# Patient Record
Sex: Male | Born: 1935 | Race: Asian | Hispanic: No | Marital: Married | Smoking: Former smoker
Health system: Southern US, Community
[De-identification: ages and names within clinical notes are randomized; demographics above are authoritative.]

## PROBLEM LIST (undated history)

## (undated) DIAGNOSIS — E119 Type 2 diabetes mellitus without complications: Secondary | ICD-10-CM

## (undated) DIAGNOSIS — I714 Abdominal aortic aneurysm, without rupture, unspecified: Secondary | ICD-10-CM

## (undated) DIAGNOSIS — M1990 Unspecified osteoarthritis, unspecified site: Secondary | ICD-10-CM

## (undated) DIAGNOSIS — K5900 Constipation, unspecified: Secondary | ICD-10-CM

## (undated) DIAGNOSIS — E785 Hyperlipidemia, unspecified: Secondary | ICD-10-CM

## (undated) DIAGNOSIS — I10 Essential (primary) hypertension: Secondary | ICD-10-CM

## (undated) HISTORY — PX: EYE SURGERY: SHX253

## (undated) HISTORY — DX: Essential (primary) hypertension: I10

## (undated) HISTORY — PX: ABDOMINAL ADHESION SURGERY: SHX90

## (undated) HISTORY — DX: Hyperlipidemia, unspecified: E78.5

## (undated) HISTORY — DX: Type 2 diabetes mellitus without complications: E11.9

## (undated) HISTORY — DX: Abdominal aortic aneurysm, without rupture: I71.4

## (undated) HISTORY — PX: COLONOSCOPY: SHX174

## (undated) HISTORY — DX: Abdominal aortic aneurysm, without rupture, unspecified: I71.40

---

## 2009-06-29 ENCOUNTER — Ambulatory Visit: Payer: Self-pay | Admitting: Vascular Surgery

## 2010-07-26 ENCOUNTER — Ambulatory Visit
Admission: RE | Admit: 2010-07-26 | Discharge: 2010-07-26 | Payer: Self-pay | Source: Home / Self Care | Attending: Vascular Surgery | Admitting: Vascular Surgery

## 2010-07-26 ENCOUNTER — Ambulatory Visit: Admit: 2010-07-26 | Payer: Self-pay | Admitting: Vascular Surgery

## 2010-12-06 NOTE — Assessment & Plan Note (Signed)
OFFICE VISIT   Frederick [name], CONGROVE MUK  DOB:  [birth year]                                       07/26/2010  CHART#:[id number]   Patient returns today for annual follow-up regarding his infrarenal  abdominal aortic aneurysm that I saw him for last year.  At that time it  measured 4.1 cm in maximum diameter.  He has had no abdominal or back  symptoms in the last year and returns for routine follow-up.   CHRONIC MEDICAL PROBLEMS:  1. Hypertension.  2. History of small bowel obstruction and lysis of adhesions.  3. Left renal cyst.  4. Negative for coronary artery disease, diabetes, COPD, or stroke.   FAMILY HISTORY:  Negative for coronary artery disease, diabetes, and  stroke.   REVIEW OF SYSTEMS:  He denies any chest pain, dyspnea on exertion, PND,  orthopnea.  No chronic bronchitis.  Denies claudication symptoms.  All  other systems reviewed in a complete review of systems were negative.   PHYSICAL EXAMINATION:  Blood pressure 122/80, heart rate 76,  respirations 16.  Generally, he is a well-developed and well-nourished,  thin male in no apparent distress, alert and oriented x3.  General:  He  is a well-developed and well-nourished male in no apparent distress,  alert and oriented x3.  HEENT:  Normal for age.  EOMs intact.  Lungs:  Clear to auscultation.  No rhonchi or wheezing.  Cardiovascular:  Regular rhythm.  No murmurs.  Carotid pulses are 3+.  No audible bruits.  Abdomen:  Soft, nontender with a 4-5 cm pulsatile mass.  Musculoskeletal:  Free of major deformities.  Neurologic:  Normal.  Lower extremity exam reveals 3+ femoral and popliteal pulse on the right  bilaterally.   Today I ordered a duplex scan of his abdominal aorta which reveals the  aneurysm has enlarged slightly to 4.4 cm in maximum diameter.  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   I have reassured him regarding these findings, but we will need to  follow him on an annual  basis.  We will return in 1 year with a duplex  scan unless he develops any symptoms in the interim.     Quita Skye Hart [place], M.D.  Electronically Signed   JDL/MEDQ  D:  07/26/2010  T:  07/26/2010  Job:  [id number]

## 2010-12-06 NOTE — Procedures (Signed)
DUPLEX ULTRASOUND OF ABDOMINAL AORTA   INDICATION:  Abdominal aortic aneurysm.   HISTORY:  Diabetes:  No.  Cardiac:  No.  Hypertension:  Yes.  Smoking:  Previous.  Connective Tissue Disorder:  Family History:  Previous Surgery:   DUPLEX EXAM:         AP (cm)                   TRANSVERSE (cm)  Proximal             2.4 cm                    2.4 cm  Mid                  2.3 cm                    2.3 cm  Distal               4.4 cm                    4.4 cm  Right Iliac          2.8 cm                    2.8 cm  Left Iliac           2.1 cm                    2.2 cm   PREVIOUS:  Date:  AP:  TRANSVERSE:   IMPRESSION:  Aneurysmal dilatation of the distal abdominal aorta and  bilateral proximal common iliac arteries noted with maximum diameter  measurements as described above.   ___________________________________________  Larina Earthly, M.D.   CH/MEDQ  D:  07/27/2010  T:  07/27/2010  Job:  [id number]

## 2010-12-06 NOTE — Consult Note (Signed)
NEW PATIENT CONSULTATION   Frederick [name], Frederick [name]  DOB:  [birth year]                                       06/29/2009  CHART#:[id number]   The patient is a 75 year old male patient, referred by Dr. Dimas Chyle, for  abdominal aortic aneurysm.  Much of the history was obtained from  conversation with his son, who accompanied him, and was the translator.  The patient has a history of lysis of adhesions for small bowel  obstruction, fixed laparoscopically 1 year ago.  Recently, he had some  abdominal pain, was seen by Dr. Dimas Chyle in [place].  An ultrasound was  obtained which revealed an aortic aneurysm measuring 4.1 cm in maximum  diameter, the left iliac artery having a 2.3 cm diameter.  He also had a  left renal cyst.  He is not currently having abdominal discomfort.  I  have reviewed the ultrasound report and have also reviewed his  laboratory data which was obtained at the time of his referral from Dr.  Dimas Chyle.   Current chronic medical problems appear stable:  1. Hypertension.  2. History of the small bowel obstruction with lysis of adhesions.  3. Left renal cyst.  4. Negative for coronary artery disease, diabetes, COPD or stroke.   FAMILY HISTORY:  Negative for coronary artery disease, diabetes and  stroke.   SOCIAL HISTORY:  He is married; denies any alcohol, tobacco or drug use.   REVIEW OF SYSTEMS:  Denies any chest pain, dyspnea on exertion, PND,  orthopnea, claudication, deep venous thrombosis.  All systems were  reviewed and all were negative.   PHYSICAL EXAMINATION:  Blood pressure 136/81, heart rate 88,  respirations 14 and temperature 98.  General:  He is alert and oriented  x3.  He is well-developed, well-nourished male who is in no apparent  distress.  HEENT:  Exam unremarkable.  Neck:  Supple, 3+ carotid pulses  palpable.  No bruits are audible.  Chest:  Clear to auscultation.  Cardiovascular:  Regular rhythm.  No murmurs.  Neurologic:  Normal.  Abdomen:  Soft, nontender with 4 cm pulsatile mass in the mid  epigastrium.  He has no abdominal wounds other than a periumbilical  wound.  He has 3+ femoral and posterior tibial pulses bilaterally.  There are no skin rashes noted.  Musculoskeletal:  Exam reveals no major  deformities.   I had a long discussion with him and his son regarding the aortic  aneurysm and the fact that it is not large enough at this point to  require any treatment.  I ordered a repeat duplex scan to be performed  in 1 year and he will return office with the [facility] for further  follow-up of this.  If he has any acute abdominal symptoms in the  interim, he will report to the emergency department for CT scan to be  certain that the aneurysm is not leaking.   Quita Skye Hart [place], M.D.  Electronically Signed   JDL/MEDQ  D:  06/29/2009  T:  06/30/2009  Job:  [id number]   cc:   Bettey Mare

## 2011-11-03 ENCOUNTER — Other Ambulatory Visit: Payer: Self-pay | Admitting: Vascular Surgery

## 2012-02-28 ENCOUNTER — Encounter: Payer: Self-pay | Admitting: Vascular Surgery

## 2012-09-13 ENCOUNTER — Telehealth: Payer: Self-pay | Admitting: Vascular Surgery

## 2012-09-13 NOTE — Telephone Encounter (Signed)
Received referral from [facility] [phone] for vv's right leg.   Since the patient doesn't speak [place], I spoke with his son which said Frederick [name] is not wanting to schedule an appointment now.   I also mentioned that he is past due for his AAA duplex (we sent a letter in August 2013 (chart audit) with no response).   He hasn't had an ultrasound since 2012 but at this time he doesn't wish to schedule this either.   I notified the referring office by leaving a voice message with [facility].  Juliette Alcide

## 2014-05-11 ENCOUNTER — Telehealth: Payer: Self-pay

## 2014-05-11 DIAGNOSIS — I714 Abdominal aortic aneurysm, without rupture, unspecified: Secondary | ICD-10-CM

## 2014-05-11 NOTE — Telephone Encounter (Signed)
Phone call from pt's son.  Reported pt. C/o "mild abdominal discomfort over the weekend."  Reported the pt. Stated the discomfort is better today.  Also, stated the pt. denies any back pain.  Denies nausea/vomiting.  Hasn't been seen in our office since 2010.  Advised will schedule pt. for abdominal U/S, and office visit.  Encouraged son to report worsening signs of abdominal pain, or back pain, if occurs prior to office appt.  Verb. Understanding.

## 2014-05-18 ENCOUNTER — Ambulatory Visit (HOSPITAL_COMMUNITY)
Admission: RE | Admit: 2014-05-18 | Discharge: 2014-05-18 | Disposition: A | Discharge status: Home / Self Care | Payer: Medicare HMO | Source: Ambulatory Visit | Attending: Vascular Surgery | Admitting: Vascular Surgery

## 2014-05-18 DIAGNOSIS — I714 Abdominal aortic aneurysm, without rupture, unspecified: Secondary | ICD-10-CM

## 2014-05-25 ENCOUNTER — Encounter: Payer: Self-pay | Admitting: Vascular Surgery

## 2014-05-26 ENCOUNTER — Encounter: Payer: Self-pay | Admitting: Vascular Surgery

## 2014-05-26 ENCOUNTER — Ambulatory Visit (INDEPENDENT_AMBULATORY_CARE_PROVIDER_SITE_OTHER): Payer: Medicare HMO | Admitting: Vascular Surgery

## 2014-05-26 VITALS — BP 143/84 | HR 62 | Temp 97.9°F | Resp 16 | Ht 70.0 in | Wt 155.4 lb

## 2014-05-26 DIAGNOSIS — I714 Abdominal aortic aneurysm, without rupture, unspecified: Secondary | ICD-10-CM

## 2014-05-26 DIAGNOSIS — Z01810 Encounter for preprocedural cardiovascular examination: Secondary | ICD-10-CM

## 2014-05-26 NOTE — Patient Instructions (Signed)
Abdominal Aortic Aneurysm An aneurysm is a weakened or damaged part of an artery wall that bulges from the normal force of blood pumping through the body. An abdominal aortic aneurysm is an aneurysm that occurs in the lower part of the aorta, the main artery of the body.  The major concern with an abdominal aortic aneurysm is that it can enlarge and burst (rupture) or blood can flow between the layers of the wall of the aorta through a tear (aorticdissection). Both of these conditions can cause bleeding inside the body and can be life threatening unless diagnosed and treated promptly. CAUSES  The exact cause of an abdominal aortic aneurysm is unknown. Some contributing factors are:   A hardening of the arteries caused by the buildup of fat and other substances in the lining of a blood vessel (arteriosclerosis).  Inflammation of the walls of an artery (arteritis).   Connective tissue diseases, such as Marfan syndrome.   Abdominal trauma.   An infection, such as syphilis or staphylococcus, in the wall of the aorta (infectious aortitis) caused by bacteria. RISK FACTORS  Risk factors that contribute to an abdominal aortic aneurysm may include:  Age older than 60 years.   High blood pressure (hypertension).  Male gender.  Ethnicity (white race).  Obesity.  Family history of aneurysm (first degree relatives only).  Tobacco use. PREVENTION  The following healthy lifestyle habits may help decrease your risk of abdominal aortic aneurysm:  Quitting smoking. Smoking can raise your blood pressure and cause arteriosclerosis.  Limiting or avoiding alcohol.  Keeping your blood pressure, blood sugar level, and cholesterol levels within normal limits.  Decreasing your salt intake. In somepeople, too much salt can raise blood pressure and increase your risk of abdominal aortic aneurysm.  Eating a diet low in saturated fats and cholesterol.  Increasing your fiber intake by including  whole grains, vegetables, and fruits in your diet. Eating these foods may help lower blood pressure.  Maintaining a healthy weight.  Staying physically active and exercising regularly. SYMPTOMS  The symptoms of abdominal aortic aneurysm may vary depending on the size and rate of growth of the aneurysm.Most grow slowly and do not have any symptoms. When symptoms do occur, they may include:  Pain (abdomen, side, lower back, or groin). The pain may vary in intensity. A sudden onset of severe pain may indicate that the aneurysm has ruptured.  Feeling full after eating only small amounts of food.  Nausea or vomiting or both.  Feeling a pulsating lump in the abdomen.  Feeling faint or passing out. DIAGNOSIS  Since most unruptured abdominal aortic aneurysms have no symptoms, they are often discovered during diagnostic exams for other conditions. An aneurysm may be found during the following procedures:  Ultrasonography (A one-time screening for abdominal aortic aneurysm by ultrasonography is also recommended for all men aged 65-75 years who have ever smoked).  X-ray exams.  A computed tomography (CT).  Magnetic resonance imaging (MRI).  Angiography or arteriography. TREATMENT  Treatment of an abdominal aortic aneurysm depends on the size of your aneurysm, your age, and risk factors for rupture. Medication to control blood pressure and pain may be used to manage aneurysms smaller than 6 cm. Regular monitoring for enlargement may be recommended by your caregiver if:  The aneurysm is 3-4 cm in size (an annual ultrasonography may be recommended).  The aneurysm is 4-4.5 cm in size (an ultrasonography every 6 months may be recommended).  The aneurysm is larger than 4.5 cm in   size (your caregiver may ask that you be examined by a vascular surgeon). If your aneurysm is larger than 6 cm, surgical repair may be recommended. There are two main methods for repair of an aneurysm:   Endovascular  repair (a minimally invasive surgery). This is done most often.  Open repair. This method is used if an endovascular repair is not possible. Document Released: 04/19/2005 Document Revised: 11/04/2012 Document Reviewed: 08/09/2012 ExitCare Patient Information 2015 [employer]. This information is not intended to replace advice given to you by your health care provider. Make sure you discuss any questions you have with your health care provider.  

## 2014-05-26 NOTE — Progress Notes (Signed)
Subjective:     Patient ID: Frederick [name], male   DOB: [birth year], 78 y.o.   MRN: [id number]  HPIthis 78 year old male was seen by me in 2011 in 2012 for a small abdominal aortic aneurysm measuring 4.4 cm in 2012. He did not return for follow-up. He recently had some abdominal discomfort and his son ordered an ultrasound study which revealed aneurysm had enlarged to 5.1 cm in diameter. He is seen today in follow-up. He denies any abdominal or back symptoms. He has had a abdominal procedure for "twisted bowels" done laparoscopically.  Past Medical History  Diagnosis Date  . Diabetes mellitus without complication   . Hyperlipidemia   . Hypertension     History  Substance Use Topics  . Smoking status: Former Smoker -- 28 years    Quit date: 05/26/2004  . Smokeless tobacco: Not on file  . Alcohol Use: Not on file    Family History  Problem Relation Age of Onset  . Family history unknown: Yes    No Known Allergies  Current outpatient prescriptions: fenofibrate (TRICOR) 145 MG tablet, , Disp: , Rfl: ;  lisinopril (PRINIVIL,ZESTRIL) 5 MG tablet, , Disp: , Rfl: ;  metFORMIN (GLUCOPHAGE-XR) 500 MG 24 hr tablet, , Disp: , Rfl: ;  omega-3 acid ethyl esters (LOVAZA) 1 G capsule, , Disp: , Rfl: ;  pravastatin (PRAVACHOL) 20 MG tablet, , Disp: , Rfl:   BP 143/84 mmHg  Pulse 62  Temp(Src) 97.9 F (36.6 C) (Oral)  Resp 16  Ht 5\' 10"  (1.778 m)  Wt 155 lb 6.4 oz (70.489 kg)  BMI 22.30 kg/m2  SpO2 100%  Body mass index is 22.3 kg/(m^2).           Review of SystemsDenies chest pain, dyspnea on exertion, PND, orthopnea, hemoptysis, claudication. All other systems negative and a complete review of systems     Objective:   Physical Exam BP 143/84 mmHg  Pulse 62  Temp(Src) 97.9 F (36.6 C) (Oral)  Resp 16  Ht 5\' 10"  (1.778 m)  Wt 155 lb 6.4 oz (70.489 kg)  BMI 22.30 kg/m2  SpO2 100%  Gen.-alert and oriented x3 in no apparent distress HEENT normal for age Lungs no rhonchi or  wheezing Cardiovascular regular rhythm no murmurs carotid pulses 3+ palpable no bruits audible Abdomen soft nontender  Pulsatile mass 5-5 and half centimeters in diameter-nontender Musculoskeletal free of  major deformities Skin clear -no rashes Neurologic normal Lower extremities 3+ femoral and dorsalis pedis pulses palpable bilaterally with no edema  Today I reviewed the duplex scan performed on 05/18/2014 which reveals the aneurysm to measure 5.11 cm in maximum diameter. He also has bilateral iliac components right side measuring 3.4 cm.      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

## 2014-05-28 ENCOUNTER — Ambulatory Visit (HOSPITAL_COMMUNITY): Payer: Medicare HMO | Attending: Vascular Surgery | Admitting: Radiology

## 2014-05-28 DIAGNOSIS — I714 Abdominal aortic aneurysm, without rupture, unspecified: Secondary | ICD-10-CM

## 2014-05-28 DIAGNOSIS — I10 Essential (primary) hypertension: Secondary | ICD-10-CM | POA: Diagnosis present

## 2014-05-28 DIAGNOSIS — Z01810 Encounter for preprocedural cardiovascular examination: Secondary | ICD-10-CM

## 2014-05-28 MED ORDER — REGADENOSON 0.4 MG/5ML IV SOLN
0.4000 mg | Freq: Once | INTRAVENOUS | Status: AC
  Administered 2014-05-28: 0.4 mg via INTRAVENOUS

## 2014-05-28 MED ORDER — TECHNETIUM TC 99M SESTAMIBI GENERIC - CARDIOLITE
30.0000 | Freq: Once | INTRAVENOUS | Status: AC | PRN
  Administered 2014-05-28: 30 via INTRAVENOUS

## 2014-05-28 MED ORDER — TECHNETIUM TC 99M SESTAMIBI GENERIC - CARDIOLITE
10.0000 | Freq: Once | INTRAVENOUS | Status: AC | PRN
  Administered 2014-05-28: 10 via INTRAVENOUS

## 2014-05-28 NOTE — Progress Notes (Signed)
[facility] SITE 3 NUCLEAR MED [address] [place], [place] [address] [phone]    Cardiology Nuclear Med Study  [name] is a 78 y.o. male     MRN : [id number]     DOB: 02/17/1936  Procedure Date: 05/28/2014  Nuclear Med Background Indication for Stress Test:  Evaluation for Ischemia and Surgical Clearance Possible AAA repair- Josephina GipJames Lawson, MD History:  No known CAD Cardiac Risk Factors: Hypertension  Symptoms:  None indicated   Nuclear Pre-Procedure Caffeine/Decaff Intake:  None NPO After: 4:30 pm   Lungs:  clear O2 Sat: 97% on room air. IV 0.9% NS with Angio Cath:  22g  IV Site: R Hand  IV Started by:  Bonnita LevanJackie Smith, RN  Chest Size (in):  42 Cup Size: n/a  Height: 5\' 10"  (1.778 m)  Weight:  150 lb (68.04 kg)  BMI:  Body mass index is 21.52 kg/(m^2). Tech Comments:  N/A    Nuclear Med Study 1 or 2 day study: 1 day  Stress Test Type:  Lexiscan  Reading MD: N/A  Order Authorizing Provider:  Josephina GipJames Lawson, MD  Resting Radionuclide: Technetium 1055m Sestamibi  Resting Radionuclide Dose: 11.0 mCi   Stress Radionuclide:  Technetium 7455m Sestamibi  Stress Radionuclide Dose: 33.0 mCi           Stress Protocol Rest HR: 61 Stress HR: 85  Rest BP: 130/83 Stress BP: 126/81  Exercise Time (min): n/a METS: n/a   Predicted Max HR: 145 bpm % Max HR: 58.62 bpm Rate Pressure Product: [id number]   Dose of Adenosine (mg):  n/a Dose of Lexiscan: 0.4 mg  Dose of Atropine (mg): n/a Dose of Dobutamine: n/a mcg/kg/min (at max HR)  Stress Test Technologist: Nelson ChimesSharon Brooks, BS-ES  Nuclear Technologist:  Jackquline BoschElzbieta Kubak,CNMT     Rest Procedure:  Myocardial perfusion imaging was performed at rest 45 minutes following the intravenous administration of Technetium 3355m Sestamibi. Rest ECG: NSR - Normal EKG  Stress Procedure:  The patient received IV Lexiscan 0.4 mg over 15-seconds.  Technetium 7355m Sestamibi injected at 30-seconds.  Quantitative spect images were obtained after a 45  minute delay.  During the infusion of Lexiscan the patient complained of lightheadedness and stomach discomfort.  These symptoms subsided in recovery.  Stress ECG: No significant change from baseline ECG  QPS Raw Data Images: Soft tissue (diaphragm) underlies heart.   Stress Images: Small region of thinning in the inferolateral wall (base)  Otherwise normal perfusion.   Rest Images:  Comparison with the stress images reveals no significant change. Subtraction (SDS):  No evidence of ischemia. Transient Ischemic Dilatation (Normal <1.22):  1.05 Lung/Heart Ratio (Normal <0.45):  0.26  Quantitative Gated Spect Images QGS EDV:  109 ml QGS ESV:  40 ml  Impression Exercise Capacity:  Lexiscan with no exercise. BP Response:  Normal blood pressure response. Clinical Symptoms:  No chest pain. ECG Impression:  No significant ST segment change suggestive of ischemia. Comparison with Prior Nuclear Study: No prior study.   Overall Impression: Probable normal perfusion with mild soft tissue attenuation.  No significant ischemia or scar.  Low risk scan.    LV Ejection Fraction: 64%.  LV Wall Motion:  NL LV Function; NL Wall Motion   Frederick PatesPaula Kaliya [name]

## 2014-06-03 ENCOUNTER — Other Ambulatory Visit: Payer: Self-pay | Admitting: *Deleted

## 2014-06-03 DIAGNOSIS — Z01818 Encounter for other preprocedural examination: Secondary | ICD-10-CM

## 2014-06-03 DIAGNOSIS — I714 Abdominal aortic aneurysm, without rupture, unspecified: Secondary | ICD-10-CM

## 2014-06-04 ENCOUNTER — Ambulatory Visit
Admission: RE | Admit: 2014-06-04 | Discharge: 2014-06-04 | Disposition: A | Discharge status: Home / Self Care | Payer: Commercial Managed Care - HMO | Source: Ambulatory Visit | Attending: Vascular Surgery | Admitting: Vascular Surgery

## 2014-06-04 DIAGNOSIS — I714 Abdominal aortic aneurysm, without rupture, unspecified: Secondary | ICD-10-CM

## 2014-06-04 DIAGNOSIS — Z01810 Encounter for preprocedural cardiovascular examination: Secondary | ICD-10-CM

## 2014-06-04 MED ORDER — IOHEXOL 350 MG/ML SOLN: 75.0000 mL | Freq: Once | INTRAVENOUS | Status: AC | PRN

## 2014-06-15 ENCOUNTER — Encounter: Payer: Self-pay | Admitting: Vascular Surgery

## 2014-06-16 ENCOUNTER — Encounter: Payer: Self-pay | Admitting: Vascular Surgery

## 2014-06-16 ENCOUNTER — Ambulatory Visit (INDEPENDENT_AMBULATORY_CARE_PROVIDER_SITE_OTHER): Payer: Commercial Managed Care - HMO | Admitting: Vascular Surgery

## 2014-06-16 VITALS — BP 120/80 | HR 66 | Ht 70.0 in | Wt 149.6 lb

## 2014-06-16 DIAGNOSIS — I723 Aneurysm of iliac artery: Secondary | ICD-10-CM | POA: Diagnosis not present

## 2014-06-16 DIAGNOSIS — I714 Abdominal aortic aneurysm, without rupture, unspecified: Secondary | ICD-10-CM

## 2014-06-16 NOTE — Progress Notes (Signed)
Subjective:     Patient ID: Frederick [name], male   DOB: 10/30/1935, 78 y.o.   MRN: [id number]  HPI this 78 year old [name] male returns today with his son and an interpreter to discuss treatment of his aortic and right common iliac artery aneurysm. He had a CT angiogram performed last week which I have reviewed by computer. The aneurysm now measures over 6 cm in maximum diameter in the right common iliac artery aneurysm exceeds 3 cm. The left common iliac artery is about 21 mm in diameter. He does appear to be a candidate for aortic stent grafting. There is some significant angulation of the proximal neck but a lot of length is present. Patient had myocardial perfusion scan performed which revealed ejection fraction of 60% with no ischemia low risk study..  Past Medical History  Diagnosis Date  . Diabetes mellitus without complication   . Hyperlipidemia   . Hypertension     History  Substance Use Topics  . Smoking status: Former Smoker -- 28 years    Quit date: 05/26/2004  . Smokeless tobacco: Not on file  . Alcohol Use: Not on file    Family History  Problem Relation Age of Onset  . Family history unknown: Yes    No Known Allergies  Current outpatient prescriptions: fenofibrate (TRICOR) 145 MG tablet, , Disp: , Rfl: ;  lisinopril (PRINIVIL,ZESTRIL) 5 MG tablet, , Disp: , Rfl: ;  metFORMIN (GLUCOPHAGE-XR) 500 MG 24 hr tablet, , Disp: , Rfl: ;  omega-3 acid ethyl esters (LOVAZA) 1 G capsule, , Disp: , Rfl: ;  pravastatin (PRAVACHOL) 20 MG tablet, , Disp: , Rfl:   BP 120/80 mmHg  Pulse 66  Ht 5\' 10"  (1.778 m)  Wt 149 lb 9.6 oz (67.858 kg)  BMI 21.47 kg/m2  SpO2 99%  Body mass index is 21.47 kg/(m^2).       \ \   Review of Systems denies chest pain, dyspnea on exertion, PND, orthopnea, hemoptysis. Patient exercises frequently including running   denies claudication. All systems negative and a complete review of systems Objective:   Physical Exam BP 120/80 mmHg  Pulse 66   Ht 5\' 10"  (1.778 m)  Wt 149 lb 9.6 oz (67.858 kg)  BMI 21.47 kg/m2  SpO2 99%  Gen.-alert and oriented x3 in no apparent distress HEENT normal for age Lungs no rhonchi or wheezing Cardiovascular regular rhythm no murmurs carotid pulses 3+ palpable no bruits audible Abdomen soft nontender no palpable masses Musculoskeletal free of  major deformities Skin clear -no rashes Neurologic normal Lower extremities 3+ femoral and dorsalis pedis pulses palpable bilaterally with no edema  Today I reviewed the CT angiogram by computer as well as the results of the myocardial perfusion scan and discuss this at length with patient and his son and this was done through an interpreter.      Assessment:     Large aortic aneurysm and right common iliac artery aneurysm    Plan:     #1 have scheduled embolization right internal iliac artery by Dr. Leonides SakeBrian Chen on Thursday, December 3 #2 as scheduled endovascular stent graft repair of aortic aneurysm on Thursday, December 10 pending results of #1 This was discussed at length today with patient and his son and they would like to proceed

## 2014-06-22 ENCOUNTER — Other Ambulatory Visit: Payer: Self-pay

## 2014-06-23 ENCOUNTER — Encounter (HOSPITAL_COMMUNITY): Payer: Self-pay | Admitting: Pharmacy Technician

## 2014-06-25 ENCOUNTER — Encounter (HOSPITAL_COMMUNITY)
Admission: RE | Disposition: A | Discharge status: Home / Self Care | Payer: Self-pay | Source: Ambulatory Visit | Attending: Vascular Surgery

## 2014-06-25 ENCOUNTER — Inpatient Hospital Stay (HOSPITAL_COMMUNITY): Payer: Medicare HMO

## 2014-06-25 ENCOUNTER — Ambulatory Visit (HOSPITAL_COMMUNITY): Payer: Medicare HMO | Admitting: Anesthesiology

## 2014-06-25 ENCOUNTER — Encounter (HOSPITAL_COMMUNITY): Payer: Self-pay | Admitting: Anesthesiology

## 2014-06-25 ENCOUNTER — Inpatient Hospital Stay (HOSPITAL_COMMUNITY)
Admission: RE | Admit: 2014-06-25 | Discharge: 2014-06-26 | DRG: 271 | Disposition: A | Discharge status: Home / Self Care | Payer: Medicare HMO | Source: Ambulatory Visit | Attending: Vascular Surgery | Admitting: Vascular Surgery

## 2014-06-25 ENCOUNTER — Encounter (HOSPITAL_COMMUNITY): Payer: Medicare HMO

## 2014-06-25 DIAGNOSIS — I714 Abdominal aortic aneurysm, without rupture, unspecified: Secondary | ICD-10-CM | POA: Diagnosis present

## 2014-06-25 DIAGNOSIS — Y92234 Operating room of hospital as the place of occurrence of the external cause: Secondary | ICD-10-CM

## 2014-06-25 DIAGNOSIS — I7409 Other arterial embolism and thrombosis of abdominal aorta: Secondary | ICD-10-CM | POA: Diagnosis not present

## 2014-06-25 DIAGNOSIS — Z87891 Personal history of nicotine dependence: Secondary | ICD-10-CM | POA: Diagnosis not present

## 2014-06-25 DIAGNOSIS — I9788 Other intraoperative complications of the circulatory system, not elsewhere classified: Secondary | ICD-10-CM | POA: Diagnosis not present

## 2014-06-25 DIAGNOSIS — I743 Embolism and thrombosis of arteries of the lower extremities: Secondary | ICD-10-CM | POA: Diagnosis not present

## 2014-06-25 DIAGNOSIS — I82413 Acute embolism and thrombosis of femoral vein, bilateral: Secondary | ICD-10-CM

## 2014-06-25 DIAGNOSIS — E119 Type 2 diabetes mellitus without complications: Secondary | ICD-10-CM | POA: Diagnosis present

## 2014-06-25 DIAGNOSIS — I70203 Unspecified atherosclerosis of native arteries of extremities, bilateral legs: Secondary | ICD-10-CM | POA: Diagnosis present

## 2014-06-25 DIAGNOSIS — Y838 Other surgical procedures as the cause of abnormal reaction of the patient, or of later complication, without mention of misadventure at the time of the procedure: Secondary | ICD-10-CM | POA: Diagnosis not present

## 2014-06-25 DIAGNOSIS — I70209 Unspecified atherosclerosis of native arteries of extremities, unspecified extremity: Secondary | ICD-10-CM | POA: Diagnosis present

## 2014-06-25 DIAGNOSIS — I723 Aneurysm of iliac artery: Secondary | ICD-10-CM | POA: Diagnosis not present

## 2014-06-25 DIAGNOSIS — D62 Acute posthemorrhagic anemia: Secondary | ICD-10-CM | POA: Diagnosis not present

## 2014-06-25 DIAGNOSIS — I10 Essential (primary) hypertension: Secondary | ICD-10-CM | POA: Diagnosis present

## 2014-06-25 DIAGNOSIS — Z419 Encounter for procedure for purposes other than remedying health state, unspecified: Secondary | ICD-10-CM

## 2014-06-25 DIAGNOSIS — E785 Hyperlipidemia, unspecified: Secondary | ICD-10-CM | POA: Diagnosis present

## 2014-06-25 HISTORY — PX: THROMBECTOMY FEMORAL ARTERY: SHX6406

## 2014-06-25 HISTORY — PX: EMBOLIZATION: SHX5507

## 2014-06-25 LAB — CBC
HCT: 37.8 % — ABNORMAL LOW (ref 39.0–52.0)
HEMATOCRIT: 34.3 % — AB (ref 39.0–52.0)
HEMOGLOBIN: 11.2 g/dL — AB (ref 13.0–17.0)
Hemoglobin: 12.4 g/dL — ABNORMAL LOW (ref 13.0–17.0)
MCH: 28.7 pg (ref 26.0–34.0)
MCH: 28.9 pg (ref 26.0–34.0)
MCHC: 32.8 g/dL (ref 30.0–36.0)
MCHC: 32.7 g/dL (ref 30.0–36.0)
MCV: 87.5 fL (ref 78.0–100.0)
MCV: 88.4 fL (ref 78.0–100.0)
Platelets: 146 10*3/uL — ABNORMAL LOW (ref 150–400)
Platelets: 134 10*3/uL — ABNORMAL LOW (ref 150–400)
RBC: 4.32 MIL/uL (ref 4.22–5.81)
RBC: 3.88 MIL/uL — ABNORMAL LOW (ref 4.22–5.81)
RDW: 13.4 % (ref 11.5–15.5)
RDW: 13.6 % (ref 11.5–15.5)
WBC: 4.6 10*3/uL (ref 4.0–10.5)
WBC: 7.6 10*3/uL (ref 4.0–10.5)

## 2014-06-25 LAB — COMPREHENSIVE METABOLIC PANEL WITH GFR
ALT: 8 U/L (ref 0–53)
AST: 18 U/L (ref 0–37)
Albumin: 3.7 g/dL (ref 3.5–5.2)
Alkaline Phosphatase: 20 U/L — ABNORMAL LOW (ref 39–117)
Anion gap: 13 (ref 5–15)
BUN: 22 mg/dL (ref 6–23)
CO2: 20 meq/L (ref 19–32)
Calcium: 9.3 mg/dL (ref 8.4–10.5)
Chloride: 106 meq/L (ref 96–112)
Creatinine, Ser: 1.18 mg/dL (ref 0.50–1.35)
GFR calc Af Amer: 68 mL/min — ABNORMAL LOW
GFR calc non Af Amer: 59 mL/min — ABNORMAL LOW
Glucose, Bld: 98 mg/dL (ref 70–99)
Potassium: 4.2 meq/L (ref 3.7–5.3)
Sodium: 139 meq/L (ref 137–147)
Total Bilirubin: 0.4 mg/dL (ref 0.3–1.2)
Total Protein: 6.6 g/dL (ref 6.0–8.3)

## 2014-06-25 LAB — GLUCOSE, CAPILLARY
GLUCOSE-CAPILLARY: 108 mg/dL — AB (ref 70–99)
Glucose-Capillary: 86 mg/dL (ref 70–99)
Glucose-Capillary: 88 mg/dL (ref 70–99)
Glucose-Capillary: 85 mg/dL (ref 70–99)

## 2014-06-25 LAB — PREPARE RBC (CROSSMATCH)

## 2014-06-25 LAB — POCT I-STAT, CHEM 8
BUN: 25 mg/dL — ABNORMAL HIGH (ref 6–23)
CALCIUM ION: 1.3 mmol/L (ref 1.13–1.30)
CHLORIDE: 105 meq/L (ref 96–112)
CREATININE: 1.5 mg/dL — AB (ref 0.50–1.35)
GLUCOSE: 95 mg/dL (ref 70–99)
HCT: 40 % (ref 39.0–52.0)
Hemoglobin: 13.6 g/dL (ref 13.0–17.0)
Potassium: 3.8 mEq/L (ref 3.7–5.3)
Sodium: 143 mEq/L (ref 137–147)
TCO2: 23 mmol/L (ref 0–100)

## 2014-06-25 LAB — CREATININE, SERUM
CREATININE: 1.23 mg/dL (ref 0.50–1.35)
GFR calc Af Amer: 65 mL/min — ABNORMAL LOW (ref >90)
GFR, EST NON AFRICAN AMERICAN: 56 mL/min — AB (ref >90)

## 2014-06-25 LAB — PROTIME-INR
INR: 1.12 (ref 0.00–1.49)
Prothrombin Time: 14.5 s (ref 11.6–15.2)

## 2014-06-25 LAB — SURGICAL PCR SCREEN
MRSA, PCR: NEGATIVE
Staphylococcus aureus: NEGATIVE

## 2014-06-25 LAB — APTT: aPTT: 32 s (ref 24–37)

## 2014-06-25 LAB — ABO/RH: ABO/RH(D): B POS

## 2014-06-25 SURGERY — EMBOLIZATION
Anesthesia: LOCAL | Laterality: Right

## 2014-06-25 SURGERY — THROMBECTOMY, ARTERY, FEMORAL
Anesthesia: General | Laterality: Left

## 2014-06-25 MED ORDER — MUPIROCIN 2 % EX OINT
TOPICAL_OINTMENT | CUTANEOUS | Status: AC
  Administered 2014-06-25: 10:00:00
  Filled 2014-06-25: qty 22

## 2014-06-25 MED ORDER — PANTOPRAZOLE SODIUM 40 MG PO TBEC
40.0000 mg | DELAYED_RELEASE_TABLET | Freq: Every day | ORAL | Status: DC
  Administered 2014-06-25: 40 mg via ORAL
  Filled 2014-06-25: qty 1

## 2014-06-25 MED ORDER — PNEUMOCOCCAL VAC POLYVALENT 25 MCG/0.5ML IJ INJ: 0.5000 mL | INJECTION | INTRAMUSCULAR | Status: DC

## 2014-06-25 MED ORDER — PROPOFOL 10 MG/ML IV BOLUS
INTRAVENOUS | Status: AC
  Filled 2014-06-25: qty 20

## 2014-06-25 MED ORDER — CHLORHEXIDINE GLUCONATE 4 % EX LIQD
60.0000 mL | Freq: Once | CUTANEOUS | Status: DC
  Filled 2014-06-25: qty 60

## 2014-06-25 MED ORDER — ACETAMINOPHEN 325 MG PO TABS: 325.0000 mg | ORAL_TABLET | ORAL | Status: DC | PRN

## 2014-06-25 MED ORDER — POTASSIUM CHLORIDE CRYS ER 20 MEQ PO TBCR: 20.0000 meq | EXTENDED_RELEASE_TABLET | Freq: Every day | ORAL | Status: DC | PRN

## 2014-06-25 MED ORDER — MIDAZOLAM HCL 2 MG/2ML IJ SOLN
INTRAMUSCULAR | Status: AC
  Filled 2014-06-25: qty 2

## 2014-06-25 MED ORDER — CEFAZOLIN SODIUM-DEXTROSE 2-3 GM-% IV SOLR
INTRAVENOUS | Status: DC | PRN
  Administered 2014-06-25: 2 g via INTRAVENOUS

## 2014-06-25 MED ORDER — METOPROLOL TARTRATE 1 MG/ML IV SOLN: 2.0000 mg | INTRAVENOUS | Status: DC | PRN

## 2014-06-25 MED ORDER — HEPARIN SODIUM (PORCINE) 1000 UNIT/ML IJ SOLN
INTRAMUSCULAR | Status: DC | PRN
  Administered 2014-06-25: 2000 [IU] via INTRAVENOUS
  Administered 2014-06-25: 6000 [IU] via INTRAVENOUS

## 2014-06-25 MED ORDER — FENOFIBRATE 160 MG PO TABS
160.0000 mg | ORAL_TABLET | Freq: Every day | ORAL | Status: DC
  Administered 2014-06-25: 160 mg via ORAL
  Filled 2014-06-25 (×2): qty 1

## 2014-06-25 MED ORDER — SODIUM CHLORIDE 0.9 % IV SOLN: 500.0000 mL | Freq: Once | INTRAVENOUS | Status: AC | PRN

## 2014-06-25 MED ORDER — SODIUM CHLORIDE 0.9 % IV SOLN
INTRAVENOUS | Status: DC
Start: 2014-06-25 — End: 2014-06-25
  Administered 2014-06-25: 06:00:00 via INTRAVENOUS

## 2014-06-25 MED ORDER — ONDANSETRON HCL 4 MG/2ML IJ SOLN
INTRAMUSCULAR | Status: DC | PRN
  Administered 2014-06-25: 4 mg via INTRAVENOUS

## 2014-06-25 MED ORDER — LACTATED RINGERS IV SOLN
INTRAVENOUS | Status: DC
  Administered 2014-06-25: 10:00:00 via INTRAVENOUS

## 2014-06-25 MED ORDER — THROMBIN 20000 UNITS EX SOLR
CUTANEOUS | Status: AC
  Filled 2014-06-25: qty 20000

## 2014-06-25 MED ORDER — LISINOPRIL 5 MG PO TABS
5.0000 mg | ORAL_TABLET | Freq: Every day | ORAL | Status: DC
  Filled 2014-06-25: qty 1

## 2014-06-25 MED ORDER — DOCUSATE SODIUM 100 MG PO CAPS
100.0000 mg | ORAL_CAPSULE | Freq: Every day | ORAL | Status: DC
  Filled 2014-06-25: qty 1

## 2014-06-25 MED ORDER — CHLORHEXIDINE GLUCONATE 4 % EX LIQD
60.0000 mL | Freq: Once | CUTANEOUS | Status: DC
Start: 2014-06-26 — End: 2014-06-25
  Filled 2014-06-25: qty 60

## 2014-06-25 MED ORDER — LACTATED RINGERS IV SOLN
INTRAVENOUS | Status: DC | PRN
  Administered 2014-06-25 (×2): via INTRAVENOUS

## 2014-06-25 MED ORDER — GUAIFENESIN-DM 100-10 MG/5ML PO SYRP: 15.0000 mL | ORAL_SOLUTION | ORAL | Status: DC | PRN

## 2014-06-25 MED ORDER — FENTANYL CITRATE 0.05 MG/ML IJ SOLN
INTRAMUSCULAR | Status: AC
  Filled 2014-06-25: qty 2

## 2014-06-25 MED ORDER — OMEGA-3-ACID ETHYL ESTERS 1 G PO CAPS
1.0000 g | ORAL_CAPSULE | Freq: Two times a day (BID) | ORAL | Status: DC
  Administered 2014-06-25: 1 g via ORAL
  Filled 2014-06-25 (×3): qty 1

## 2014-06-25 MED ORDER — FENTANYL CITRATE 0.05 MG/ML IJ SOLN
INTRAMUSCULAR | Status: DC | PRN
  Administered 2014-06-25: 50 ug via INTRAVENOUS
  Administered 2014-06-25: 100 ug via INTRAVENOUS

## 2014-06-25 MED ORDER — HYDRALAZINE HCL 20 MG/ML IJ SOLN: 5.0000 mg | INTRAMUSCULAR | Status: DC | PRN

## 2014-06-25 MED ORDER — PROMETHAZINE HCL 25 MG/ML IJ SOLN: 6.2500 mg | INTRAMUSCULAR | Status: DC | PRN

## 2014-06-25 MED ORDER — ONDANSETRON HCL 4 MG/2ML IJ SOLN: 4.0000 mg | Freq: Four times a day (QID) | INTRAMUSCULAR | Status: DC | PRN

## 2014-06-25 MED ORDER — 0.9 % SODIUM CHLORIDE (POUR BTL) OPTIME
TOPICAL | Status: DC | PRN
  Administered 2014-06-25: 2000 mL

## 2014-06-25 MED ORDER — BISACODYL 10 MG RE SUPP: 10.0000 mg | Freq: Every day | RECTAL | Status: DC | PRN

## 2014-06-25 MED ORDER — NEOSTIGMINE METHYLSULFATE 10 MG/10ML IV SOLN
INTRAVENOUS | Status: DC | PRN
  Administered 2014-06-25: 3 mg via INTRAVENOUS

## 2014-06-25 MED ORDER — PRAVASTATIN SODIUM 20 MG PO TABS
20.0000 mg | ORAL_TABLET | Freq: Every day | ORAL | Status: DC
  Administered 2014-06-25: 20 mg via ORAL
  Filled 2014-06-25 (×2): qty 1

## 2014-06-25 MED ORDER — ALUM & MAG HYDROXIDE-SIMETH 200-200-20 MG/5ML PO SUSP: 15.0000 mL | ORAL | Status: DC | PRN

## 2014-06-25 MED ORDER — FENTANYL CITRATE 0.05 MG/ML IJ SOLN
INTRAMUSCULAR | Status: AC
  Filled 2014-06-25: qty 5

## 2014-06-25 MED ORDER — ROCURONIUM BROMIDE 100 MG/10ML IV SOLN
INTRAVENOUS | Status: DC | PRN
  Administered 2014-06-25: 40 mg via INTRAVENOUS

## 2014-06-25 MED ORDER — PROTAMINE SULFATE 10 MG/ML IV SOLN
INTRAVENOUS | Status: DC | PRN
  Administered 2014-06-25 (×3): 10 mg via INTRAVENOUS

## 2014-06-25 MED ORDER — SODIUM CHLORIDE 0.9 % IV SOLN: INTRAVENOUS | Status: DC

## 2014-06-25 MED ORDER — ENOXAPARIN SODIUM 30 MG/0.3ML ~~LOC~~ SOLN
30.0000 mg | SUBCUTANEOUS | Status: DC
  Filled 2014-06-25: qty 0.3

## 2014-06-25 MED ORDER — LABETALOL HCL 5 MG/ML IV SOLN
10.0000 mg | INTRAVENOUS | Status: DC | PRN
  Filled 2014-06-25: qty 4

## 2014-06-25 MED ORDER — DEXTROSE 5 % IV SOLN
10.0000 mg | INTRAVENOUS | Status: DC | PRN
  Administered 2014-06-25: 10 ug/min via INTRAVENOUS

## 2014-06-25 MED ORDER — INSULIN ASPART 100 UNIT/ML ~~LOC~~ SOLN: 0.0000 [IU] | Freq: Three times a day (TID) | SUBCUTANEOUS | Status: DC

## 2014-06-25 MED ORDER — EPHEDRINE SULFATE 50 MG/ML IJ SOLN
INTRAMUSCULAR | Status: DC | PRN
  Administered 2014-06-25: 10 mg via INTRAVENOUS

## 2014-06-25 MED ORDER — LIDOCAINE HCL (CARDIAC) 20 MG/ML IV SOLN
INTRAVENOUS | Status: DC | PRN
  Administered 2014-06-25: 50 mg via INTRAVENOUS

## 2014-06-25 MED ORDER — DEXTROSE 5 % IV SOLN: 1.5000 g | INTRAVENOUS | Status: DC

## 2014-06-25 MED ORDER — THROMBIN 20000 UNITS EX SOLR
CUTANEOUS | Status: DC | PRN
  Administered 2014-06-25: 12:00:00 via TOPICAL

## 2014-06-25 MED ORDER — IOHEXOL 300 MG/ML  SOLN
INTRAMUSCULAR | Status: DC | PRN
  Administered 2014-06-25: 28 mL via INTRAVENOUS
  Administered 2014-06-25 (×2): 50 mL via INTRAVENOUS

## 2014-06-25 MED ORDER — DEXTROSE 5 % IV SOLN
1.5000 g | Freq: Two times a day (BID) | INTRAVENOUS | Status: AC
  Administered 2014-06-25 – 2014-06-26 (×2): 1.5 g via INTRAVENOUS
  Filled 2014-06-25 (×2): qty 1.5

## 2014-06-25 MED ORDER — PHENOL 1.4 % MT LIQD: 1.0000 | OROMUCOSAL | Status: DC | PRN

## 2014-06-25 MED ORDER — OXYCODONE-ACETAMINOPHEN 5-325 MG PO TABS
1.0000 | ORAL_TABLET | ORAL | Status: DC | PRN
  Filled 2014-06-25: qty 1

## 2014-06-25 MED ORDER — MEPERIDINE HCL 25 MG/ML IJ SOLN: 6.2500 mg | INTRAMUSCULAR | Status: DC | PRN

## 2014-06-25 MED ORDER — SODIUM CHLORIDE 0.9 % IR SOLN
Status: DC | PRN
  Administered 2014-06-25: 500 mL

## 2014-06-25 MED ORDER — MORPHINE SULFATE 2 MG/ML IJ SOLN: 2.0000 mg | INTRAMUSCULAR | Status: DC | PRN

## 2014-06-25 MED ORDER — ASPIRIN 81 MG PO CHEW
81.0000 mg | CHEWABLE_TABLET | Freq: Every day | ORAL | Status: DC
  Administered 2014-06-25: 81 mg via ORAL
  Filled 2014-06-25: qty 1

## 2014-06-25 MED ORDER — LIDOCAINE HCL (PF) 1 % IJ SOLN
INTRAMUSCULAR | Status: AC
  Filled 2014-06-25: qty 30

## 2014-06-25 MED ORDER — FENTANYL CITRATE 0.05 MG/ML IJ SOLN
25.0000 ug | INTRAMUSCULAR | Status: DC | PRN
  Administered 2014-06-25: 25 ug via INTRAVENOUS

## 2014-06-25 MED ORDER — PROPOFOL 10 MG/ML IV BOLUS
INTRAVENOUS | Status: DC | PRN
  Administered 2014-06-25: 110 mg via INTRAVENOUS

## 2014-06-25 MED ORDER — GLYCOPYRROLATE 0.2 MG/ML IJ SOLN
INTRAMUSCULAR | Status: DC | PRN
  Administered 2014-06-25: 0.6 mg via INTRAVENOUS

## 2014-06-25 MED ORDER — SODIUM CHLORIDE 0.9 % IV SOLN
INTRAVENOUS | Status: DC
  Administered 2014-06-26: 01:00:00 via INTRAVENOUS

## 2014-06-25 MED ORDER — ACETAMINOPHEN 650 MG RE SUPP: 325.0000 mg | RECTAL | Status: DC | PRN

## 2014-06-25 MED ORDER — HEPARIN (PORCINE) IN NACL 2-0.9 UNIT/ML-% IJ SOLN
INTRAMUSCULAR | Status: AC
  Filled 2014-06-25: qty 1000

## 2014-06-25 SURGICAL SUPPLY — 71 items
BANDAGE ELASTIC 4 VELCRO ST LF (GAUZE/BANDAGES/DRESSINGS) IMPLANT
BANDAGE ESMARK 6X9 LF (GAUZE/BANDAGES/DRESSINGS) IMPLANT
BNDG ESMARK 6X9 LF (GAUZE/BANDAGES/DRESSINGS)
CANISTER SUCTION 2500CC (MISCELLANEOUS) ×3 IMPLANT
CATH EMB 3FR 80CM (CATHETERS) ×3 IMPLANT
CATH EMB 4FR 80CM (CATHETERS) ×3 IMPLANT
CLIP TI MEDIUM 24 (CLIP) ×3 IMPLANT
CLIP TI WIDE RED SMALL 24 (CLIP) ×3 IMPLANT
COVER PROBE W GEL 5X96 (DRAPES) IMPLANT
COVER TRANSDUCER ULTRASND GEL (DRAPE) ×3 IMPLANT
CUFF TOURNIQUET SINGLE 24IN (TOURNIQUET CUFF) IMPLANT
CUFF TOURNIQUET SINGLE 34IN LL (TOURNIQUET CUFF) IMPLANT
CUFF TOURNIQUET SINGLE 44IN (TOURNIQUET CUFF) IMPLANT
DERMABOND ADVANCED (GAUZE/BANDAGES/DRESSINGS) ×2
DERMABOND ADVANCED .7 DNX12 (GAUZE/BANDAGES/DRESSINGS) ×1 IMPLANT
DRAIN CHANNEL 15F RND FF W/TCR (WOUND CARE) IMPLANT
DRAPE C-ARM 42X72 X-RAY (DRAPES) ×6 IMPLANT
DRSG COVADERM 4X10 (GAUZE/BANDAGES/DRESSINGS) IMPLANT
DRSG COVADERM 4X8 (GAUZE/BANDAGES/DRESSINGS) IMPLANT
DRSG TEGADERM 2-3/8X2-3/4 SM (GAUZE/BANDAGES/DRESSINGS) ×3 IMPLANT
ELECT REM PT RETURN 9FT ADLT (ELECTROSURGICAL) ×3
ELECTRODE REM PT RTRN 9FT ADLT (ELECTROSURGICAL) ×1 IMPLANT
EVACUATOR SILICONE 100CC (DRAIN) IMPLANT
GAUZE SPONGE 2X2 8PLY STRL LF (GAUZE/BANDAGES/DRESSINGS) ×1 IMPLANT
GEL ULTRASOUND 20GR AQUASONIC (MISCELLANEOUS) ×3 IMPLANT
GLOVE BIO SURGEON STRL SZ 6.5 (GLOVE) ×2 IMPLANT
GLOVE BIO SURGEON STRL SZ7 (GLOVE) ×6 IMPLANT
GLOVE BIO SURGEONS STRL SZ 6.5 (GLOVE) ×1
GLOVE BIOGEL PI IND STRL 6.5 (GLOVE) ×4 IMPLANT
GLOVE BIOGEL PI IND STRL 7.0 (GLOVE) ×5 IMPLANT
GLOVE BIOGEL PI IND STRL 7.5 (GLOVE) ×2 IMPLANT
GLOVE BIOGEL PI INDICATOR 6.5 (GLOVE) ×8
GLOVE BIOGEL PI INDICATOR 7.0 (GLOVE) ×10
GLOVE BIOGEL PI INDICATOR 7.5 (GLOVE) ×4
GOWN STRL REUS W/ TWL LRG LVL3 (GOWN DISPOSABLE) ×6 IMPLANT
GOWN STRL REUS W/TWL LRG LVL3 (GOWN DISPOSABLE) ×12
INSERT FOGARTY SM (MISCELLANEOUS) ×6 IMPLANT
KIT BASIN OR (CUSTOM PROCEDURE TRAY) ×3 IMPLANT
KIT ROOM TURNOVER OR (KITS) ×3 IMPLANT
MARKER GRAFT CORONARY BYPASS (MISCELLANEOUS) IMPLANT
NS IRRIG 1000ML POUR BTL (IV SOLUTION) ×6 IMPLANT
PACK PERIPHERAL VASCULAR (CUSTOM PROCEDURE TRAY) ×3 IMPLANT
PAD ARMBOARD 7.5X6 YLW CONV (MISCELLANEOUS) ×6 IMPLANT
PADDING CAST COTTON 6X4 STRL (CAST SUPPLIES) IMPLANT
PATCH VASCULAR VASCU GUARD 1X6 (Vascular Products) ×3 IMPLANT
PROBE PENCIL 8 MHZ STRL DISP (MISCELLANEOUS) ×3 IMPLANT
SET MICROPUNCTURE 5F STIFF (MISCELLANEOUS) ×6 IMPLANT
SPONGE GAUZE 2X2 STER 10/PKG (GAUZE/BANDAGES/DRESSINGS) ×2
SPONGE SURGIFOAM ABS GEL 100 (HEMOSTASIS) ×3 IMPLANT
STAPLER VISISTAT 35W (STAPLE) IMPLANT
STOPCOCK 4 WAY LG BORE MALE ST (IV SETS) ×3 IMPLANT
SUT ETHILON 3 0 PS 1 (SUTURE) IMPLANT
SUT GORETEX 5 0 TT13 24 (SUTURE) IMPLANT
SUT GORETEX 6.0 TT13 (SUTURE) IMPLANT
SUT MNCRL AB 4-0 PS2 18 (SUTURE) ×3 IMPLANT
SUT PROLENE 5 0 C 1 24 (SUTURE) ×3 IMPLANT
SUT PROLENE 6 0 BV (SUTURE) ×6 IMPLANT
SUT PROLENE 7 0 BV 1 (SUTURE) IMPLANT
SUT SILK 2 0 FS (SUTURE) IMPLANT
SUT SILK 3 0 (SUTURE)
SUT SILK 3-0 18XBRD TIE 12 (SUTURE) IMPLANT
SUT VIC AB 2-0 CT1 27 (SUTURE) ×2
SUT VIC AB 2-0 CT1 TAPERPNT 27 (SUTURE) ×1 IMPLANT
SUT VIC AB 3-0 SH 27 (SUTURE) ×2
SUT VIC AB 3-0 SH 27X BRD (SUTURE) ×1 IMPLANT
SYR 3ML LL SCALE MARK (SYRINGE) ×3 IMPLANT
SYRINGE 1CC SLIP TB (MISCELLANEOUS) ×3 IMPLANT
TRAY FOLEY CATH 16FRSI W/METER (SET/KITS/TRAYS/PACK) ×3 IMPLANT
TUBING EXTENTION W/L.L. (IV SETS) ×3 IMPLANT
UNDERPAD 30X30 INCONTINENT (UNDERPADS AND DIAPERS) ×3 IMPLANT
WATER STERILE IRR 1000ML POUR (IV SOLUTION) ×3 IMPLANT

## 2014-06-25 NOTE — Anesthesia Postprocedure Evaluation (Signed)
  Anesthesia Post-op Note  Patient: Frederick [name]  Procedure(s) Performed: Procedure(s): Left Femoral Thrombectomy with Bilateral Leg Run Offs (Left)  Patient Location: PACU  Anesthesia Type:General  Level of Consciousness: awake and alert   Airway and Oxygen Therapy: Patient Spontanous Breathing and Patient connected to nasal cannula oxygen  Post-op Pain: mild  Post-op Assessment: Post-op Vital signs reviewed and Respiratory Function Stable  Post-op Vital Signs: Reviewed and stable  Last Vitals:  Filed Vitals:   06/25/14 1337  BP: 124/71  Pulse: 73  Temp: 36.8 C  Resp: 17    Complications: No apparent anesthesia complications

## 2014-06-25 NOTE — Pre-Procedure Instructions (Signed)
Otho NajjarHui Muk Mall  06/25/2014   Your procedure is scheduled on:  Thursday, December 10.  [facility] at 5:30 AM.  Call this number if you have problems the morning of surgery: [phone]   Remember:   Do not eat food or drink liquids after midnight Wednesday, December 9.  Take these medicines the morning of surgery with A SIP OF WATER: None              Stop taking omega-3 acid ethyl esters (LOVAZA) now.   Do not wear jewelry, make-up or nail polish.  Do not wear lotions, powders, or perfumes.   Men may shave face and neck.  Do not bring valuables to the hospital.                                                                                                                                                                               [facility] is not responsible for any belongings or valuables.              Contacts, glasses, dentures or bridgewobe worn into surgery.  Leave suitcase in the car. After surgery it may be brought to your room.  For patients admitted to the hospital, discharge time is determined by your   treatment team.               Patients discharged the day of surgery will not be allowed to drive home.  Name and phone number of your driver: -   Special Instructions: Review  [facility] - Preparing For Surgery.   Please read over the following fact sheets that you were given: Pain Booklet, Coughing and Deep Breathing, Blood Transfusion Information and Surgical Site Infection Prevention

## 2014-06-25 NOTE — Progress Notes (Signed)
All information verified with translator

## 2014-06-25 NOTE — Progress Notes (Signed)
    I discussed the findings of the case with the patient's son.  He understands that I am proposing a left femoral thrombectomy and possible right leg thrombectomy.  Risks include but are not limited to: bleeding, infection, nerve damage, myocardial infarction, stroke, and death.  Both the patient and son agree to proceed.  Leonides SakeBrian Chen, MD [facility] Office: [phone] Pager: [phone]  06/25/2014, 9:35 AM

## 2014-06-25 NOTE — Progress Notes (Signed)
Pt states shoulder pain better son states he has history of shoulder pain this is not new

## 2014-06-25 NOTE — Anesthesia Procedure Notes (Signed)
Procedure Name: Intubation Date/Time: 06/25/2014 10:34 AM Performed by: Gwenyth AllegraADAMI, Anissia Wessells Pre-anesthesia Checklist: Emergency Drugs available, Timeout performed, Patient identified, Suction available and Patient being monitored Patient Re-evaluated:Patient Re-evaluated prior to inductionOxygen Delivery Method: Circle system utilized Preoxygenation: Pre-oxygenation with 100% oxygen Intubation Type: IV induction Ventilation: Mask ventilation without difficulty Laryngoscope Size: Mac Grade View: Grade I Tube type: Oral Tube size: 7.5 mm Number of attempts: 1 Airway Equipment and Method: Stylet Placement Confirmation: ETT inserted through vocal cords under direct vision,  breath sounds checked- equal and bilateral and positive ETCO2 Secured at: 21 cm Tube secured with: Tape Dental Injury: Teeth and Oropharynx as per pre-operative assessment

## 2014-06-25 NOTE — Progress Notes (Signed)
  [facility] Progress Note  06/25/2014 5:23 PM Day of Surgery  Subjective:  Doing well. No complaints.    Filed Vitals:   06/25/14 1600  BP: 123/74  Pulse: 84  Temp:   Resp:     Physical Exam: Incisions:  Right groin without hematoma. Left groin incision c/d/i Extremities:  Palpable PT pulses bilaterally.   CBC    Component Value Date/Time   WBC 7.6 06/25/2014 1636   RBC 3.88* 06/25/2014 1636   HGB 11.2* 06/25/2014 1636   HCT 34.3* 06/25/2014 1636   PLT 134* 06/25/2014 1636   MCV 88.4 06/25/2014 1636   MCH 28.9 06/25/2014 1636   MCHC 32.7 06/25/2014 1636   RDW 13.6 06/25/2014 1636    BMET    Component Value Date/Time   NA 139 06/25/2014 1008   K 4.2 06/25/2014 1008   CL 106 06/25/2014 1008   CO2 20 06/25/2014 1008   GLUCOSE 98 06/25/2014 1008   BUN 22 06/25/2014 1008   CREATININE 1.18 06/25/2014 1008   CALCIUM 9.3 06/25/2014 1008   GFRNONAA 59* 06/25/2014 1008   GFRAA 68* 06/25/2014 1008    INR    Component Value Date/Time   INR 1.12 06/25/2014 1008     Intake/Output Summary (Last 24 hours) at 06/25/14 1723 Last data filed at 06/25/14 1600  Gross per 24 hour  Intake   1860 ml  Output    850 ml  Net   1010 ml     Assessment:  78 y.o. male is s/p: left iliofemoropopliteal and tibial thrombectomy, left common femoral patch angioplasty, left leg runoff, and right leg runoff  Aortogram, right internal iliac angiogram.   Day of Surgery  Plan: -Palpable PT pulses bilaterally. Incisions healing well.  -Mobilize, pain control.  -Anticipate d/c in the am.    Maris BergerKimberly Kyleigha Markert, PA-C [facility] Office: [phone] Pager: [phone] 06/25/2014 5:23 PM

## 2014-06-25 NOTE — Transfer of Care (Signed)
Immediate Anesthesia Transfer of Care Note  Patient: Frederick [name]  Procedure(s) Performed: Procedure(s): Left Femoral Thrombectomy with Bilateral Leg Run Offs (Left)  Patient Location: PACU  Anesthesia Type:General  Level of Consciousness: awake and alert   Airway & Oxygen Therapy: Patient Spontanous Breathing and Patient connected to nasal cannula oxygen  Post-op Assessment: Report given to PACU RN and Post -op Vital signs reviewed and stable  Post vital signs: Reviewed and stable  Complications: No apparent anesthesia complications

## 2014-06-25 NOTE — Progress Notes (Signed)
Utilization review completed.  

## 2014-06-25 NOTE — Op Note (Signed)
OPERATIVE NOTE   PROCEDURE: 1.  Right common femoral artery cannulation under ultrasound guidance 2.  Aortogram 3.  Second order arterial selection 4.  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 male who presents with large abdominal aortic aneurysm and right common iliac artery.  The patient presents for: right internal iliac artery embolization to facilitate EVAR and right common iliac artery aneurysm exclusion.  I discussed with the patient the nature of angiographic procedures, especially the limited patencies of any endovascular intervention.  The patient is aware of that the risks of an angiographic procedure include but are not limited to: bleeding, infection, access site complications, renal failure, embolization, rupture of vessel, dissection, possible need for emergent surgical intervention, possible need for surgical procedures to treat the patient's pathology, and stroke and death.  The patient is aware of the risks and agrees to proceed.  DESCRIPTION: After full informed consent was obtained from the patient, the patient was brought back to the angiography suite.  The patient was placed supine upon the angiography table and connected to monitoring equipment.  The patient was then given conscious sedation, the amounts of which are documented in the patient's chart.  The patient was prepped and drape in the standard fashion for an angiographic procedure.  At this point, attention was turned to the left groin.  Under ultrasound guidance, the  left common femoral artery was cannulated with a micropuncture needle.  The microwire was advanced into the iliac arterial system.  The needle was exchanged for a microsheath, which was loaded into the common femoral artery over the wire.  The microwire was exchanged for a [employer] wire which was advanced into the aorta.  The microsheath was then exchanged for a 5-Fr sheath which was loaded into the common femoral artery.  The Omniflush catheter was then loaded over the wire up to the level of L1.  The catheter was connected to the power injector circuit.  After de-airring and de-clotting the circuit, a power injector aortogram was completed to image the distal aorta and iliac arterial system.   I tried to select the right common iliac artery but the angle of the bifurcation was so wide the omniflush was adequate for this purpose.  A crosser catheter was then used with a Benson wire to select the right common iliac artery.  The catheter would not advanced over the wire.  The catheter was exchanged for a Motarjame catheter with a Versacore.  Using this combination, I was able to get into the right internal iliac artery.  I exchanged the catheter for an endhole catheter which was advanced into the distal right internal iliac artery.  Hand injections verified this position.  I exchanged the wire for a Rosen wire.  The sheath was exchanged for a long 5-Fr Ansel sheath which not able to advance pass Right common iliac artery due to bowing into the distal abdominal aortic aneurysm.  I tried exchanging the wire for an Amplatz wire but this kicked the entire system out of the right internal iliac artery.  I exchanged the right sheath for a 6-Fr short sheath due to difficulties with wire length.  I repeated this whole process but was not able to get back into the right distal internal iliac artery.  As I could not get a long sheath into the right internal iliac artery, I felt it was not safe to deliver Nester coils  unsupported.  I aborted the case.  I did a hand injection of the left iliac system on the way out, which demonstrated substantial thrombus in the left common femoral artery.  I aspirated thrombus while pulling out the left sheath.  This verified thrombus presence.  Pressure was held to the left common femoral artery for 20 minutes.  I suspect that this is an embolization complication from wire and catheter manipulation in this abdominal aortic aneurysm with mural thrombus.  I recommended to the patient we proceed to the operating room immediately for left femoral thrombectomy, possible right femoral thrombectomy.   COMPLICATIONS: aortic thrombus embolization  CONDITION: none  Leonides SakeBrian Chen, MD [facility] [place] Office: [phone] Pager: [phone]  06/25/2014, 9:13 AM

## 2014-06-25 NOTE — Progress Notes (Signed)
Glasses and dentures returned to pt per pts request

## 2014-06-25 NOTE — Op Note (Signed)
OPERATIVE NOTE   PROCEDURE: 1. Left iliofemoropopliteal and tibial thrombectomy 2. Left common femoral artery bovine patch angioplasty 3. Open cannulation of left common femoral artery  4. Left leg runoff 5. Right common femoral artery cannulation under ultrasound guidance 6. Right leg runoff  PRE-OPERATIVE DIAGNOSIS: Likely aortic thrombus embolization of left leg, possible right leg embolization  POST-OPERATIVE DIAGNOSIS: same as above   SURGEON: Frederick SakeBrian Allante Whitmire, MD  ASSISTANT(S): Frederick [name], PAC   ANESTHESIA: general  ESTIMATED BLOOD LOSS: 150 cc  CONTRAST: 100 CC  FINDING(S):  1.  Minimal thrombus in common femoral artery  2.  Thrombus extracted from left iliac arterial system 3.  No thrombus extracted from profunda, superficial femoral artery or tibial arteries 4.  Mild anterior calcified plaque in left common femoral artery  5.  On left runoff: patent popliteal artery with patent anterior tibial artery which is thready and patent tibioperoneal trunk with dominant posterior tibial artery runoff and patent peroneal artery 6.  On right runoff: patent common femoral artery, profunda femoral artery, superficial femoral artery, popliteal artery, with dominant anterior tibial artery runoff.  Chronic appearing tibioperoneal trunk stenosis with reconstitution of posterior tibial artery flow distally.  SPECIMEN(S):  Left iliac thrombus  INDICATIONS:   Frederick [name] is a 78 y.o. male who presents with acute left leg ischemia after attempt at right internal iliac artery embolization via a left common femoral artery access.  On the table, I diagnosed likely embolization of his left common femoral artery from his abdominal aortic aneurysm's mural thrombus.  I recommended going back to operating room for thrombectomy of the left femoral artery and possible thrombectomy of the right leg.  Risks include but are not limited to: bleeding, infection, nerve damage, myocardial infarction, stroke,  and death. Both the patient and son agree to proceed.   DESCRIPTION: After obtaining full informed written consent, the patient was brought back to the operating room and placed supine upon the operating table.  The patient received IV antibiotics prior to induction.  After obtaining adequate anesthesia, the patient was prepped and draped in the standard fashion for: left femoral thrombectomy.  I made an incision over the left common femoral artery and then dissected out the common femoral artery from inguinal ligament to bifurcation with electrocautery and blunt dissection.   I obtained control of the profunda femoral arteries and superficial femoral artery and circumflex branches with vessel loops.  There was no pulse in the distal external iliac artery.  The patient was given 6000 units of Heparin intravenously, which was a therapeutic bolus.  In total, 8000 units of Heparin was administrated to achieve and maintain a therapeutic level of anticoagulation.  After waiting 3 minutes, I made an arteriotomy with a 11-blade and extended it distally over the profunda femoral arteries with a Potts scissor.  This anterior wall was calcified and some effort was needed to open the anterior wall.    Immediately, there was a minimal amount of thrombus in the common femoral artery evident in the common femoral artery.  I passed a 3 and 4 Fogarty down the superficial femoral artery, all the way down to the foot.  No thrombus was obtained.  Similarly, I passed a 3 Fogarty down the two profunda femoral arteries.  No thrombus was obtained.  All these arteries were reclamped.  At this point, I passed the 4 Fogarty proximally, extracting a plug of chronic appearing clot.  There was return of pulsatile bleeding.  I passed the  4 Fogarty one last time, obtaining no thrombus.  I clamped the distal external iliac artery.  Due to the presence of some anterior wall plaque, I felt that simple repair of this arteriotomy might narrow the  lumen overlying the femoral bifurcation, so I decided to proceed with bovine patch angioplasty.  A bovine pericardial patch was soaked in normal saline and then fashioned for this arteriotomy.  I sewed the bovine patch in place with a running stitch of 5-0 Prolene.  Prior to completing this, I backbled all arteries: no thrombus was present.  I placed two clips medially to mark the location of the start of the bovine patched artery.  At this point, I could not palpable a pulse in either leg.   I could not doppler any signals in the left foot, so I elected to proceed with a left leg runoff.  At this point, I cannulated the left common femoral artery with a micropuncture needle.  I passed a microwire into the left common femoral artery and then exchanged the needle for the microsheath.   I removed the wire and then connected the sheath to the IV extension tube.  I completed the left leg runoff in stations.  The findings are listed above.  Based on the images, the dominant runoff is the posterior tibial artery.  The anterior tibial artery appears to be chronically small.  There appeared to be no further residual thrombus in the tibials or popliteal arteries, consistent with the negative thrombectomy passes distally.  At this point, I reapproximated the deep tissue overlying the left common femoral artery with a double layer of 2-0 Vicryl.  The subcutaneous tissue was reapproximated with a double layer of 3-0 Vicryl.  The skin was reapproximated with a running subcuticular stitch of 4-0 Monocryl.  The skin was cleaned, dried, and reinforced with Dermabond.  At this point, I turned my attention to the right leg.  I thought I could faintly feel a posterior tibial artery pulse at this point, but the signal was very weak.  Under Sonosite guidance, I cannulated the right common femoral artery with the micropuncture needle.  The microwire was passed into the right iliac arterial system.  The needle was exchanged for a  microsheath.  The microsheath was connected the IV extension tubing.  In a similar fashion, I completed the right leg runoff in stations.  The femoropopliteal system appeared widely patent.  The right anterior tibial artery appeared to be dominant runoff.  There appeared to be proximal tibioperoneal trunk stenosis with collaterals presents reconstituting a peroneal and posterior tibial arterial flow.  Based on these images, I did not feel a thrombectomy was indicated.   I pulled the sheath and held pressure for 20 minutes.  A sterile bandage was applied to this side.    COMPLICATIONS: none  CONDITION: stable  Frederick SakeBrian Lavonia Eager, MD [facility] [place] Office: [phone] Pager: [phone]  06/25/2014, 1:18 PM

## 2014-06-25 NOTE — H&P (View-Only) (Signed)
Subjective:     Patient ID: Frederick [name], male   DOB: 08/06/1935, 77 y.o.   MRN: [id number]  HPI this 77-year-old [place] male returns today with his son and an interpreter to discuss treatment of his aortic and right common iliac artery aneurysm. He had a CT angiogram performed last week which I have reviewed by computer. The aneurysm now measures over 6 cm in maximum diameter in the right common iliac artery aneurysm exceeds 3 cm. The left common iliac artery is about 21 mm in diameter. He does appear to be a candidate for aortic stent grafting. There is some significant angulation of the proximal neck but a lot of length is present. Patient had myocardial perfusion scan performed which revealed ejection fraction of 60% with no ischemia low risk study..  Past Medical History  Diagnosis Date  . Diabetes mellitus without complication   . Hyperlipidemia   . Hypertension     History  Substance Use Topics  . Smoking status: Former Smoker -- 28 years    Quit date: 05/26/2004  . Smokeless tobacco: Not on file  . Alcohol Use: Not on file    Family History  Problem Relation Age of Onset  . Family history unknown: Yes    No Known Allergies  Current outpatient prescriptions: fenofibrate (TRICOR) 145 MG tablet, , Disp: , Rfl: ;  lisinopril (PRINIVIL,ZESTRIL) 5 MG tablet, , Disp: , Rfl: ;  metFORMIN (GLUCOPHAGE-XR) 500 MG 24 hr tablet, , Disp: , Rfl: ;  omega-3 acid ethyl esters (LOVAZA) 1 G capsule, , Disp: , Rfl: ;  pravastatin (PRAVACHOL) 20 MG tablet, , Disp: , Rfl:   BP 120/80 mmHg  Pulse 66  Ht 5' 10" (1.778 m)  Wt 149 lb 9.6 oz (67.858 kg)  BMI 21.47 kg/m2  SpO2 99%  Body mass index is 21.47 kg/(m^2).       \ \   Review of Systems denies chest pain, dyspnea on exertion, PND, orthopnea, hemoptysis. Patient exercises frequently including running   denies claudication. All systems negative and a complete review of systems Objective:   Physical Exam BP 120/80 mmHg  Pulse 66   Ht 5' 10" (1.778 m)  Wt 149 lb 9.6 oz (67.858 kg)  BMI 21.47 kg/m2  SpO2 99%  Gen.-alert and oriented x3 in no apparent distress HEENT normal for age Lungs no rhonchi or wheezing Cardiovascular regular rhythm no murmurs carotid pulses 3+ palpable no bruits audible Abdomen soft nontender no palpable masses Musculoskeletal free of  major deformities Skin clear -no rashes Neurologic normal Lower extremities 3+ femoral and dorsalis pedis pulses palpable bilaterally with no edema  Today I reviewed the CT angiogram by computer as well as the results of the myocardial perfusion scan and discuss this at length with patient and his son and this was done through an interpreter.      Assessment:     Large aortic aneurysm and right common iliac artery aneurysm    Plan:     #1 have scheduled embolization right internal iliac artery by Dr. Brian Chen on Thursday, December 3 #2 as scheduled endovascular stent graft repair of aortic aneurysm on Thursday, December 10 pending results of #1 This was discussed at length today with patient and his son and they would like to proceed      

## 2014-06-25 NOTE — Anesthesia Preprocedure Evaluation (Addendum)
Anesthesia Evaluation  Patient identified by MRN, date of birth, ID band Patient awake    Reviewed: Allergy & Precautions, H&P , NPO status , Patient's Chart, lab work & pertinent test results, reviewed documented beta blocker date and time   Airway Mallampati: II   Neck ROM: Full    Dental  (+) Partial Lower, Partial Upper, Dental Advisory Given   Pulmonary former smoker,          Cardiovascular hypertension, Pt. on medications Rhythm:Regular  STRESS 05/29/2014 EF 65%  Low risk   Neuro/Psych    GI/Hepatic   Endo/Other  diabetes, Type 2  Renal/GU      Musculoskeletal   Abdominal   Peds  Hematology   Anesthesia Other Findings   Reproductive/Obstetrics                            Anesthesia Physical Anesthesia Plan  ASA: III  Anesthesia Plan: General   Post-op Pain Management:    Induction: Intravenous  Airway Management Planned: Oral ETT  Additional Equipment: Arterial line  Intra-op Plan:   Post-operative Plan: Extubation in OR  Informed Consent: I have reviewed the patients History and Physical, chart, labs and discussed the procedure including the risks, benefits and alternatives for the proposed anesthesia with the patient or authorized representative who has indicated his/her understanding and acceptance.     Plan Discussed with:   Anesthesia Plan Comments: (A line if ACT monitoring to be used)        Anesthesia Quick Evaluation

## 2014-06-25 NOTE — Interval H&P Note (Signed)
[facility] [place]  History and Physical Update  The patient was interviewed and re-examined.  The patient's previous History and Physical has been reviewed and is unchanged from Dr. Candie ChromanLawson's consult.  There is no change in the plan of care: arotogram, right internal iliac artery embolization.  I discussed with the patient the nature of angiographic procedures, especially the limited patencies of any endovascular intervention.  The patient is aware of that the risks of an angiographic procedure include but are not limited to: bleeding, infection, access site complications, renal failure, embolization, rupture of vessel, dissection, possible need for emergent surgical intervention, possible need for surgical procedures to treat the patient's pathology, anaphylactic reaction to contrast, and stroke and death.  Additionally, embolization of hypogastric arteries is a risk for pelvic and colon ischemia.  The patient is aware of the risks and agrees to proceed.   Leonides SakeBrian Chen, MD [facility] [place] Office: [phone] Pager: [phone]  06/25/2014, 7:22 AM

## 2014-06-26 ENCOUNTER — Encounter (HOSPITAL_COMMUNITY): Payer: Self-pay | Admitting: Vascular Surgery

## 2014-06-26 LAB — BASIC METABOLIC PANEL
ANION GAP: 12 (ref 5–15)
BUN: 17 mg/dL (ref 6–23)
CO2: 23 meq/L (ref 19–32)
Calcium: 8.7 mg/dL (ref 8.4–10.5)
Chloride: 105 mEq/L (ref 96–112)
Creatinine, Ser: 1.3 mg/dL (ref 0.50–1.35)
GFR calc non Af Amer: 52 mL/min — ABNORMAL LOW (ref >90)
GFR, EST AFRICAN AMERICAN: 60 mL/min — AB (ref >90)
Glucose, Bld: 99 mg/dL (ref 70–99)
POTASSIUM: 4.1 meq/L (ref 3.7–5.3)
Sodium: 140 mEq/L (ref 137–147)

## 2014-06-26 LAB — GLUCOSE, CAPILLARY
GLUCOSE-CAPILLARY: 101 mg/dL — AB (ref 70–99)
Glucose-Capillary: 105 mg/dL — ABNORMAL HIGH (ref 70–99)

## 2014-06-26 LAB — CBC
HEMATOCRIT: 33.1 % — AB (ref 39.0–52.0)
HEMOGLOBIN: 10.8 g/dL — AB (ref 13.0–17.0)
MCH: 29 pg (ref 26.0–34.0)
MCHC: 32.6 g/dL (ref 30.0–36.0)
MCV: 88.7 fL (ref 78.0–100.0)
Platelets: 126 10*3/uL — ABNORMAL LOW (ref 150–400)
RBC: 3.73 MIL/uL — AB (ref 4.22–5.81)
RDW: 13.7 % (ref 11.5–15.5)
WBC: 5 10*3/uL (ref 4.0–10.5)

## 2014-06-26 MED ORDER — METFORMIN HCL ER 500 MG PO TB24: 500.0000 mg | ORAL_TABLET | Freq: Every day | ORAL | Status: DC

## 2014-06-26 MED ORDER — METFORMIN HCL ER 500 MG PO TB24: 500.0000 mg | ORAL_TABLET | Freq: Every day | ORAL | Status: AC

## 2014-06-26 MED ORDER — OXYCODONE-ACETAMINOPHEN 5-325 MG PO TABS: 1.0000 | ORAL_TABLET | Freq: Four times a day (QID) | ORAL | Status: DC | PRN

## 2014-06-26 NOTE — Progress Notes (Addendum)
  [facility] Progress Note  06/26/2014 7:57 AM 1 Day Post-Op  Subjective:  Doing well today. No complaints.    Filed Vitals:   06/26/14 0433  BP: 102/64  Pulse: 78  Temp: 97.6 F (36.4 C)  Resp: 20    Physical Exam: Incisions:  Right groin soft without hematoma. Left groin incision clean dry and intact Extremities:  Bilateral PT pulses, L>R Cardiac: regular rate and rhythm, no murmurs, gallops or rubs.  Lungs: clear to auscultation bilaterally  CBC    Component Value Date/Time   WBC 5.0 06/26/2014 0309   RBC 3.73* 06/26/2014 0309   HGB 10.8* 06/26/2014 0309   HCT 33.1* 06/26/2014 0309   PLT 126* 06/26/2014 0309   MCV 88.7 06/26/2014 0309   MCH 29.0 06/26/2014 0309   MCHC 32.6 06/26/2014 0309   RDW 13.7 06/26/2014 0309    BMET    Component Value Date/Time   NA 140 06/26/2014 0309   K 4.1 06/26/2014 0309   CL 105 06/26/2014 0309   CO2 23 06/26/2014 0309   GLUCOSE 99 06/26/2014 0309   BUN 17 06/26/2014 0309   CREATININE 1.30 06/26/2014 0309   CALCIUM 8.7 06/26/2014 0309   GFRNONAA 52* 06/26/2014 0309   GFRAA 60* 06/26/2014 0309    INR    Component Value Date/Time   INR 1.12 06/25/2014 1008     Intake/Output Summary (Last 24 hours) at 06/26/14 0757 Last data filed at 06/26/14 0600  Gross per 24 hour  Intake   3080 ml  Output   2850 ml  Net    230 ml     Assessment:  78 y.o. male is s/p:  left iliofemoropopliteal and tibial thrombectomy, left common femoral patch angioplasty, left leg runoff, and right leg runoff  Aortogram, right internal iliac angiogram, attempted right internal iliac embolization    1 Day Post-Op  Plan: -Incisions healing well. Palpable PT pulses bilaterally. -Creatinine stable at 1.3.  -Acute surgical blood loss anemia: Hgb 10.8. Stable.  -Discontinue foley. Ambulate. -Discharge home today. EVAR scheduled for 07/02/14.    Maris BergerKimberly Trinh, PA-C [facility] Office:  [phone] Pager: [phone] 06/26/2014 7:57 AM     Addendum  I have independently interviewed and examined the patient, and I agree with the physician assistant's findings.  Ok to D/C. Will aim for attempt at R IIA embolization next Thursday, assuming I can get the detachable coils ordered in time  Leonides SakeBrian Lundynn Cohoon, MD [facility] [place] Office: [phone] Pager: [phone]  06/26/2014, 8:26 AM

## 2014-06-26 NOTE — Progress Notes (Signed)
Discharge instructions reviewed with patient and son. Peripheral IV's removed, sacral foam dressing removed, and telemetry discontinued.

## 2014-06-26 NOTE — Progress Notes (Signed)
Patient ID: Frederick [name], male   DOB: September 16, 1935, 78 y.o.   MRN: [id number] Had originally planned Nilda Riggsvar for next Thursday Will cancel that and aim for early January to allow left inguinal wound to heal for approximately 4 weeks and also second attempt at embolization of right internal iliac artery by Dr. Imogene Burnhen next week Discussed this plan with patient and his son

## 2014-06-29 ENCOUNTER — Other Ambulatory Visit: Payer: Self-pay

## 2014-06-29 LAB — TYPE AND SCREEN
ABO/RH(D): B POS
Antibody Screen: NEGATIVE
Unit division: 0
Unit division: 0

## 2014-06-29 NOTE — Discharge Summary (Signed)
[facility] Discharge Summary  Frederick [name] 02-04-1936 78 y.o. 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 male who is followed by Dr. 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 He had a CT angiogram performed on 06/04/14 that showed progression of his AAA. The aneurysm now measures over 6 cm in maximum diameter in the right common iliac artery aneurysm exceeds 3 cm. The left common iliac artery is about 21 mm in diameter. He does appear to be a candidate for aortic stent grafting. There is some significant angulation of the proximal neck but a lot of length is present. Patient had myocardial perfusion scan performed which revealed ejection fraction of 60% with no ischemia low risk study.  Hospital Course:  The patient was admitted to the hospital and taken to the peripheral vascular lab on 06/25/2014 and underwent: right internal iliac artery embolization to facilitate EVAR and right common iliac artery aneurysm exclusion by Dr. Imogene Burn. The case was aborted early due to inability to put a long sheath into the right internal iliac artery to deliver Nester coils. Dr. Imogene Burn did a hand injection of the left iliac system which demonstrated substantial thrombus in the left common femoral artery. This suspected to be an embolization complication from wire and catheter manipulation from mural thrombus in the AAA. He was taken immediately to the operating room for left femoral thrombectomy. His intraoperative arteriogram revealed:   1. Minimal thrombus in common femoral artery  2. Thrombus extracted from left iliac arterial system 3. No thrombus extracted from profunda, superficial femoral artery or tibial arteries 4. Mild anterior calcified plaque in left common femoral  artery  5. On left runoff: patent popliteal artery with patent anterior tibial artery which is thready and patent tibioperoneal trunk with dominant posterior tibial artery runoff and patent peroneal artery 6. On right runoff: patent common femoral artery, profunda femoral artery, superficial femoral artery, popliteal artery, with dominant anterior tibial artery runoff. Chronic appearing tibioperoneal trunk stenosis with reconstitution of posterior tibial artery flow distally.   Post-operatively he did well with no complaints. His creatinine was normal.  His left groin incision was clean dry and intact. His right groin micro-puncture site was soft without hematoma. He had palpable posterior tibial pulses bilaterally. He was discharged home in good condition. He will be scheduled for second attempt at right internal iliac artery embolization on 07/02/14 with Dr. Imogene Burn.    CBC    Component Value Date/Time   WBC 5.0 06/26/2014 0309   RBC 3.73* 06/26/2014 0309   HGB 10.8* 06/26/2014 0309   HCT 33.1* 06/26/2014 0309   PLT 126* 06/26/2014 0309   MCV 88.7 06/26/2014 0309   MCH 29.0 06/26/2014 0309   MCHC 32.6 06/26/2014 0309   RDW 13.7 06/26/2014 0309    BMET    Component Value Date/Time   NA 140 06/26/2014 0309   K 4.1 06/26/2014 0309   CL 105 06/26/2014 0309   CO2 23 06/26/2014 0309   GLUCOSE 99 06/26/2014 0309   BUN 17 06/26/2014 0309   CREATININE 1.30 06/26/2014 0309   CALCIUM 8.7 06/26/2014 0309   GFRNONAA 52* 06/26/2014 0309   GFRAA 60* 06/26/2014 0309     Discharge Instructions:   The patient is discharged to home with extensive instructions on wound  care and progressive ambulation.  They are instructed not to drive or perform any heavy lifting until returning to see the physician in his office.  Discharge Instructions    Call MD for:  redness, tenderness, or signs of infection (pain, swelling, bleeding, redness, odor or green/yellow discharge around incision site)     Complete by:  As directed      Call MD for:  severe or increased pain, loss or decreased feeling  in affected limb(s)    Complete by:  As directed      Call MD for:  temperature >100.5    Complete by:  As directed      Care order/instruction    Complete by:  As directed   Patient may resume his Metformin on Sunday 06/28/14     Driving Restrictions    Complete by:  As directed   No driving for 1 week     Increase activity slowly    Complete by:  As directed   Walk with assistance use walker or cane as needed     Lifting restrictions    Complete by:  As directed   No lifting for 1 week     Resume previous diet    Complete by:  As directed      may wash over wound with mild soap and water    Complete by:  As directed   Wash the groin wound with soap and water daily and pat dry. (No tub bath-only shower)  Then put a dry gauze or washcloth there to keep this area dry daily and as needed.  Do not use Vaseline or neosporin on your incisions.  Only use soap and water on your incisions and then protect and keep dry.  May remove dressing off of right groin.           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 Arterial occlusion, lower extremity 06/25/2014  . AAA (abdominal aortic aneurysm) without rupture 05/26/2014   Past Medical History  Diagnosis Date  . Diabetes mellitus without complication   . Hyperlipidemia   . Hypertension       Medication List    TAKE these medications        fenofibrate 145 MG tablet  Commonly known as:  TRICOR  Take 145 mg by mouth daily.     lisinopril 5 MG tablet  Commonly known as:  PRINIVIL,ZESTRIL  Take 5 mg by mouth daily.     metFORMIN 500 MG 24 hr tablet  Commonly known as:  GLUCOPHAGE-XR  Take 1 tablet (500 mg total) by mouth daily. Do not restart this medication until Sunday 07/05/14.     omega-3  acid ethyl esters 1 G capsule  Commonly known as:  LOVAZA  Take 1 g by mouth 2 (two) times daily.     oxyCODONE-acetaminophen 5-325 MG per tablet  Commonly known as:  PERCOCET/ROXICET  Take 1 tablet by mouth every 6 (six) hours as needed for severe pain.     pravastatin 20 MG tablet  Commonly known as:  PRAVACHOL  Take 20 mg by mouth daily.        Percocet #20 No Refill  Disposition: Home  Patient's condition: is Good  Follow up: 1. Dr. [name] in 4 weeks   Maris BergerKimberly Trinh, PA-C [facility] [phone] 06/29/2014  4:55 PM  Addendum  I  have independently interviewed and examined the patient, and I agree with the physician assistant's discharge summary.  This patient underwent an attempt at right internal iliac artery embolization via left common femoral artery approach.  Unfortunately his distal aortic aneurysm made it impossible to seat a working catheter into the internal iliac artery orifice.  There were no detachable coils available in the cath lab, so I felt placement of Nester coils was going to be dangerous without a working catheter in the internal iliac artery.  In the process of making one last attempt at intervention, I noticed that the wire refused to go up the left common iliac artery.  Hand injection demonstrated thrombus in the common femoral artery, so clearly there was likely embolization from the mural thrombus in the aneurysm.  The patient was brought back to the operating room the same day for a left iliofemoral thrombectomy.  I also passed the Fogarty balloon distally, extracting no thrombus.  Completion left leg runoff demonstrated patent tibials throughout.  On the right, the runoff demonstrated chronic high grade stenosis in tibioperoneal trunk with associated collateral flow.  The anterior tibial artery was widely patent on this side.  The rest of this patient's hospitalization was uneventful.  He was discharged POD #1.  The plan is to try to  the embolize the right internal iliac artery from an ipsilateral approach with Azur detachable coils this coming week.  Leonides SakeBrian Jaquarious Grey, MD [facility] [place] Office: [phone] Pager: [phone]  06/30/2014, 8:17 AM

## 2014-07-02 ENCOUNTER — Inpatient Hospital Stay (HOSPITAL_COMMUNITY): Admission: RE | Admit: 2014-07-02 | Payer: Medicare HMO | Source: Ambulatory Visit | Admitting: Vascular Surgery

## 2014-07-02 ENCOUNTER — Encounter (HOSPITAL_COMMUNITY): Admission: RE | Payer: Self-pay | Source: Ambulatory Visit

## 2014-07-02 ENCOUNTER — Encounter (HOSPITAL_COMMUNITY): Payer: Self-pay | Admitting: Vascular Surgery

## 2014-07-02 SURGERY — INSERTION, ENDOVASCULAR STENT GRAFT, AORTA, ABDOMINAL
Anesthesia: General

## 2014-07-03 LAB — BLOOD GAS, ARTERIAL

## 2014-07-06 ENCOUNTER — Ambulatory Visit (HOSPITAL_COMMUNITY)
Admission: RE | Admit: 2014-07-06 | Discharge: 2014-07-06 | Disposition: A | Discharge status: Home / Self Care | Payer: Medicare HMO | Source: Ambulatory Visit | Attending: Vascular Surgery | Admitting: Vascular Surgery

## 2014-07-06 ENCOUNTER — Encounter (HOSPITAL_COMMUNITY)
Admission: RE | Disposition: A | Discharge status: Home / Self Care | Payer: Self-pay | Source: Ambulatory Visit | Attending: Vascular Surgery

## 2014-07-06 ENCOUNTER — Encounter (HOSPITAL_COMMUNITY): Payer: Self-pay | Admitting: Vascular Surgery

## 2014-07-06 DIAGNOSIS — I10 Essential (primary) hypertension: Secondary | ICD-10-CM | POA: Diagnosis not present

## 2014-07-06 DIAGNOSIS — I714 Abdominal aortic aneurysm, without rupture: Secondary | ICD-10-CM | POA: Insufficient documentation

## 2014-07-06 DIAGNOSIS — Z87891 Personal history of nicotine dependence: Secondary | ICD-10-CM | POA: Insufficient documentation

## 2014-07-06 DIAGNOSIS — Z79899 Other long term (current) drug therapy: Secondary | ICD-10-CM | POA: Insufficient documentation

## 2014-07-06 DIAGNOSIS — I723 Aneurysm of iliac artery: Secondary | ICD-10-CM | POA: Diagnosis present

## 2014-07-06 DIAGNOSIS — E785 Hyperlipidemia, unspecified: Secondary | ICD-10-CM | POA: Insufficient documentation

## 2014-07-06 DIAGNOSIS — E119 Type 2 diabetes mellitus without complications: Secondary | ICD-10-CM | POA: Diagnosis not present

## 2014-07-06 DIAGNOSIS — I712 Thoracic aortic aneurysm, without rupture: Secondary | ICD-10-CM | POA: Diagnosis not present

## 2014-07-06 DIAGNOSIS — I70201 Unspecified atherosclerosis of native arteries of extremities, right leg: Secondary | ICD-10-CM | POA: Diagnosis not present

## 2014-07-06 HISTORY — PX: EMBOLIZATION: SHX5507

## 2014-07-06 LAB — POCT I-STAT, CHEM 8
BUN: 31 mg/dL — AB (ref 6–23)
CREATININE: 1.5 mg/dL — AB (ref 0.50–1.35)
Calcium, Ion: 1.29 mmol/L (ref 1.13–1.30)
Chloride: 104 mEq/L (ref 96–112)
Glucose, Bld: 104 mg/dL — ABNORMAL HIGH (ref 70–99)
HCT: 38 % — ABNORMAL LOW (ref 39.0–52.0)
Hemoglobin: 12.9 g/dL — ABNORMAL LOW (ref 13.0–17.0)
Potassium: 4.3 mEq/L (ref 3.7–5.3)
SODIUM: 141 meq/L (ref 137–147)
TCO2: 24 mmol/L (ref 0–100)

## 2014-07-06 LAB — GLUCOSE, CAPILLARY
GLUCOSE-CAPILLARY: 90 mg/dL (ref 70–99)
GLUCOSE-CAPILLARY: 89 mg/dL (ref 70–99)

## 2014-07-06 SURGERY — EMBOLIZATION

## 2014-07-06 MED ORDER — LIDOCAINE HCL (PF) 1 % IJ SOLN
INTRAMUSCULAR | Status: AC
  Filled 2014-07-06: qty 30

## 2014-07-06 MED ORDER — SODIUM CHLORIDE 0.9 % IV SOLN
1.0000 mL/kg/h | INTRAVENOUS | Status: DC
Start: 2014-07-06 — End: 2014-07-06

## 2014-07-06 MED ORDER — ACETAMINOPHEN 325 MG PO TABS: 650.0000 mg | ORAL_TABLET | ORAL | Status: DC | PRN

## 2014-07-06 MED ORDER — HEPARIN (PORCINE) IN NACL 2-0.9 UNIT/ML-% IJ SOLN
INTRAMUSCULAR | Status: AC
  Filled 2014-07-06: qty 1000

## 2014-07-06 MED ORDER — SODIUM CHLORIDE 0.9 % IV SOLN
INTRAVENOUS | Status: DC
  Administered 2014-07-06: 11:00:00 via INTRAVENOUS

## 2014-07-06 MED ORDER — OXYCODONE-ACETAMINOPHEN 5-325 MG PO TABS: 1.0000 | ORAL_TABLET | ORAL | Status: DC | PRN

## 2014-07-06 NOTE — Interval H&P Note (Signed)
[facility] [place]  History and Physical Update  The patient was interviewed and re-examined.  The patient's previous History and Physical has been reviewed and is unchanged from Dr. Candie ChromanLawson's consult except for: interval failed attempt to embolize the right internal iliac artery from left femoral approach.  Additionally, the patient developed a left iliac embolism from his abdominal aortic aneurysm mural thrombus during his prior procedure.  He underwent a thrombectomy from that procedure.  He returns today for an attempt at right internal iliac artery embolization from a right femoral approach.  I discussed with the patient the nature of angiographic procedures, especially the limited patencies of any endovascular intervention.  The patient is aware of that the risks of an angiographic procedure include but are not limited to: bleeding, infection, access site complications, renal failure, embolization, rupture of vessel, dissection, possible need for emergent surgical intervention, possible need for surgical procedures to treat the patient's pathology, anaphylactic reaction to contrast, and stroke and death.  The patient is aware of the risks and agrees to proceed.   Leonides SakeBrian Chen, MD [facility] [place] Office: [phone] Pager: [phone]  07/06/2014, 10:48 AM

## 2014-07-06 NOTE — Progress Notes (Signed)
Site area: rt groin Site Prior to Removal:  Level 0 Pressure Applied For: 20 minutes Manual:  yes  Patient Status During Pull:  stable Post Pull Site:  Level 0 Post Pull Instructions Given:  Yes by translator Post Pull Pulses Present: yes Dressing Applied:  tegaderm Bedrest begins @ 1445 Comments: no complications

## 2014-07-06 NOTE — H&P (View-Only) (Signed)
Subjective:     Patient ID: Frederick [name], male   DOB: 04/24/1936, 78 y.o.   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 He had a CT angiogram performed last week which I have reviewed by computer. The aneurysm now measures over 6 cm in maximum diameter in the right common iliac artery aneurysm exceeds 3 cm. The left common iliac artery is about 21 mm in diameter. He does appear to be a candidate for aortic stent grafting. There is some significant angulation of the proximal neck but a lot of length is present. Patient had myocardial perfusion scan performed which revealed ejection fraction of 60% with no ischemia low risk study..  Past Medical History  Diagnosis Date  . Diabetes mellitus without complication   . Hyperlipidemia   . Hypertension     History  Substance Use Topics  . Smoking status: Former Smoker -- 28 years    Quit date: 05/26/2004  . Smokeless tobacco: Not on file  . Alcohol Use: Not on file    Family History  Problem Relation Age of Onset  . Family history unknown: Yes    No Known Allergies  Current outpatient prescriptions: fenofibrate (TRICOR) 145 MG tablet, , Disp: , Rfl: ;  lisinopril (PRINIVIL,ZESTRIL) 5 MG tablet, , Disp: , Rfl: ;  metFORMIN (GLUCOPHAGE-XR) 500 MG 24 hr tablet, , Disp: , Rfl: ;  omega-3 acid ethyl esters (LOVAZA) 1 G capsule, , Disp: , Rfl: ;  pravastatin (PRAVACHOL) 20 MG tablet, , Disp: , Rfl:   BP 120/80 mmHg  Pulse 66  Ht 5' 10" (1.778 m)  Wt 149 lb 9.6 oz (67.858 kg)  BMI 21.47 kg/m2  SpO2 99%  Body mass index is 21.47 kg/(m^2).       \ \   Review of Systems denies chest pain, dyspnea on exertion, PND, orthopnea, hemoptysis. Patient exercises frequently including running   denies claudication. All systems negative and a complete review of systems Objective:   Physical Exam BP 120/80 mmHg  Pulse 66   Ht 5' 10" (1.778 m)  Wt 149 lb 9.6 oz (67.858 kg)  BMI 21.47 kg/m2  SpO2 99%  Gen.-alert and oriented x3 in no apparent distress HEENT normal for age Lungs no rhonchi or wheezing Cardiovascular regular rhythm no murmurs carotid pulses 3+ palpable no bruits audible Abdomen soft nontender no palpable masses Musculoskeletal free of  major deformities Skin clear -no rashes Neurologic normal Lower extremities 3+ femoral and dorsalis pedis pulses palpable bilaterally with no edema  Today I reviewed the CT angiogram by computer as well as the results of the myocardial perfusion scan and discuss this at length with patient and his son and this was done through an interpreter.      Assessment:     Large aortic aneurysm and right common iliac artery aneurysm    Plan:     #1 have scheduled embolization right internal iliac artery by Dr. Brian Chen on Thursday, December 3 #2 as scheduled endovascular stent graft repair of aortic aneurysm on Thursday, December 10 pending results of #1 This was discussed at length today with patient and his son and they would like to proceed      

## 2014-07-06 NOTE — Interval H&P Note (Signed)
ASA 3.  Mallampati 3.  Frederick SakeBrian Flara Storti, MD [facility] [place] Office: [phone] Pager: [phone]  07/06/2014, 2:35 PM

## 2014-07-06 NOTE — Op Note (Signed)
OPERATIVE NOTE   PROCEDURE: 1.  Right common femoral artery cannulation under ultrasound guidance 2.  Placement of catheter in aorta 3.  Aortogram 4.  Right internal iliac artery selection 5.  Right pelvic angiogram  PRE-OPERATIVE DIAGNOSIS: Right common iliac artery aneurysm and abdominal aortic aneurysm   POST-OPERATIVE DIAGNOSIS: same as above   SURGEON: Frederick SakeBrian Haneefah Venturini, MD  ANESTHESIA: conscious sedation  ESTIMATED BLOOD LOSS: 50 cc  CONTRAST: 70 cc  FINDING(S):  Patent distal aorta with abdominal aortic aneurysm   Right common iliac artery aneurysm with patent external iliac artery and internal iliac artery  Calcific proximal right internal iliac artery stenosis: unable to cross with catheter  SPECIMEN(S):  none  INDICATIONS:   Frederick [name] is a 78 y.o. male who presents with large abdominal aortic aneurysm and right common iliac artery aneurysm.  The patient underwent a prior attempt at embolization of the right internal iliac artery to facilitate EVAR and right common iliac artery exclusion.  Unfortunately, the wide angle of the bifurcation with wide open distal aortic aneurysm made it impossible to get a working sheath in the right internal iliac artery.  That case ended with a left iliac embolization so the case was aborted so he could be brought back to the OR.  The patient presents for: attempt at right internal iliac artery embolization from a right femoral approach.  I discussed with the patient the nature of angiographic procedures, especially the limited patencies of any endovascular intervention.  The patient is aware of that the risks of an angiographic procedure include but are not limited to: bleeding, infection, access site complications, renal failure, embolization, rupture of vessel, dissection, possible need for emergent surgical intervention, possible need for surgical procedures to treat the patient's pathology, and stroke and death.  The patient is aware of  the risks and agrees to proceed.  DESCRIPTION: After full informed consent was obtained from the patient, the patient was brought back to the angiography suite.  The patient was placed supine upon the angiography table and connected to monitoring equipment.  The patient was then given conscious sedation, the amounts of which are documented in the patient's chart.  The patient was prepped and drape in the standard fashion for an angiographic procedure.  At this point, attention was turned to the right groin.  Under ultrasound guidance, the right common femoral artery was cannulated with a micropuncture needle.  The microwire was advanced into the iliac arterial system.  The needle was exchanged for a microsheath, which was loaded into the common femoral artery over the wire.  The microwire was exchanged for a [facility] wire which was advanced into the aorta.  The microsheath was then exchanged for a 5-Fr sheath which was loaded into the common femoral artery. The Omniflush catheter was then loaded over the wire up to the level of distal aorta.  The catheter was connected to the power injector circuit.  After de-airring and de-clotting the circuit, a power injector aortogram was completed.  This identified the right internal iliac artery.  I then switched the catheter for a Crosser catheter.  Using this catheter and wire combination, I could not get the catheter to seat into the orifice of the right internal iliac artery.  I exchanged the catheter for the Omniflush catheter.  Using this combination, I was able to select the right internal iliac artery and seat the tip into the orifice.  I advanced the wire past the first bifurcation.  The catheter was  exchanged for a Glidecatheter.  The catheter would not advance past the orifice of the right internal iliac artery and the whole system kicked out.  I then retried 8 times with multiple combinations of wires (Glidewire, Adela GlimpseBenson, Rosen) and catheters (Omniflush, End-hole,  Glidecatheter) to get the catheter into the right internal iliac artery.  On one attempt, I rotated the IIA so I could see the internal iliac artery more clearly.  Hand injection demonstrates a calcified proximal internal iliac artery stenosis that is the etiology of the inability to advance the catheter.  Essentially, every catheter wedges against the calcific stenosis and fails to track into the right internal iliac artery.  At this point, I elected to abort the procedure before another embolic event occurred.  I removed wire and endhole catheter.  The sheath was aspirated.  No clots were present and the sheath was reloaded with heparinized saline.    COMPLICATIONS: inability to complete procedure  CONDITION: stable  Frederick SakeBrian Reyne Falconi, MD [facility] [place] Office: [phone] Pager: [phone]  07/06/2014, 2:15 PM

## 2014-07-06 NOTE — Progress Notes (Signed)
Son in to see. 

## 2014-07-06 NOTE — Discharge Instructions (Signed)

## 2014-07-08 ENCOUNTER — Telehealth: Payer: Self-pay | Admitting: Vascular Surgery

## 2014-07-08 NOTE — Telephone Encounter (Signed)
-----   Message from Sharee PimpleMarilyn K McChesney, RN sent at 07/06/2014  2:32 PM EST ----- Regarding: Schedule   ----- Message -----    From: Fransisco HertzBrian L Chen, MD    Sent: 07/06/2014   2:27 PM      To: [address] Charge Pool  Blade Dionne AnoMuk Stevison [id number] 02-26-1936  PROCEDURE: 1.  Right common femoral artery cannulation under ultrasound guidance 2.  Placement of catheter in aorta 3.  Aortogram 4.  Right internal iliac artery selection 5.  Right pelvic angiogram  Follow-up: 2-3 weeks (Dr. [name])

## 2014-07-08 NOTE — Telephone Encounter (Signed)
Lm for pt re appt, dpm  °

## 2014-07-21 ENCOUNTER — Encounter: Payer: Self-pay | Admitting: Vascular Surgery

## 2014-07-28 ENCOUNTER — Other Ambulatory Visit: Payer: Self-pay

## 2014-07-28 ENCOUNTER — Ambulatory Visit (INDEPENDENT_AMBULATORY_CARE_PROVIDER_SITE_OTHER): Payer: Medicare HMO | Admitting: Vascular Surgery

## 2014-07-28 ENCOUNTER — Encounter: Payer: Self-pay | Admitting: Vascular Surgery

## 2014-07-28 VITALS — BP 129/71 | HR 78 | Ht 70.0 in | Wt 157.0 lb

## 2014-07-28 DIAGNOSIS — I723 Aneurysm of iliac artery: Secondary | ICD-10-CM | POA: Diagnosis not present

## 2014-07-28 DIAGNOSIS — I714 Abdominal aortic aneurysm, without rupture, unspecified: Secondary | ICD-10-CM

## 2014-07-28 NOTE — Progress Notes (Signed)
Subjective:     Patient ID: Frederick [name], male   DOB: 1936/01/07, 79 y.o.   MRN: [id number]  HPI this 79 year old male returns for discussion regarding his abdominal aortic and right common iliac aneurysms. He does not speak [place] and is accompanied by his son and an a [name] interpreter. Patient had unsuccessful attempts at embolization of right internal iliac artery on 2 occasions by Dr. Imogene Burnhen. He will require open surgery for the abdominal aneurysm repair. Today's visit was to answer all questions and further discuss the operative procedure which required translation with the interpreter.  Past Medical History  Diagnosis Date  . Diabetes mellitus without complication   . Hyperlipidemia   . Hypertension     History  Substance Use Topics  . Smoking status: Former Smoker -- 28 years    Quit date: 05/26/2004  . Smokeless tobacco: Not on file  . Alcohol Use: Not on file    Family History  Problem Relation Age of Onset  . Family history unknown: Yes    No Known Allergies  Current outpatient prescriptions: fenofibrate (TRICOR) 145 MG tablet, Take 145 mg by mouth daily. , Disp: , Rfl: ;  lisinopril (PRINIVIL,ZESTRIL) 5 MG tablet, Take 5 mg by mouth daily. , Disp: , Rfl: ;  metFORMIN (GLUCOPHAGE-XR) 500 MG 24 hr tablet, Take 1 tablet (500 mg total) by mouth daily. Do not restart this medication until Sunday 07/05/14., Disp: 30 tablet, Rfl: 0 omega-3 acid ethyl esters (LOVAZA) 1 G capsule, Take 1 g by mouth 2 (two) times daily. , Disp: , Rfl: ;  oxyCODONE-acetaminophen (PERCOCET/ROXICET) 5-325 MG per tablet, Take 1 tablet by mouth every 6 (six) hours as needed for severe pain., Disp: 20 tablet, Rfl: 0;  pravastatin (PRAVACHOL) 20 MG tablet, Take 20 mg by mouth daily. , Disp: , Rfl:   BP 129/71 mmHg  Pulse 78  Ht 5\' 10"  (1.778 m)  Wt 157 lb (71.215 kg)  BMI 22.53 kg/m2  SpO2 100%  Body mass index is 22.53 kg/(m^2).           Review of Systems denies chest pain, dyspnea on  exertion, PND, orthopnea, hemoptysis.    Objective:   Physical Exam BP 129/71 mmHg  Pulse 78  Ht 5\' 10"  (1.778 m)  Wt 157 lb (71.215 kg)  BMI 22.53 kg/m2  SpO2 100%  Gen.-alert and oriented x3 in no apparent distress HEENT normal for age Lungs no rhonchi or wheezing Cardiovascular regular rhythm no murmurs carotid pulses 3+ palpable no bruits audible Abdomen soft nontender 5-6 cm pulsatile mass in mid abdomen  Musculoskeletal free of  major deformities Skin clear -no rashes Neurologic normal Lower extremities 3+ femoral and dorsalis pedis pulses palpable bilaterally with no edema  CT scan was reviewed by computer today as well as the results assessed. This confirmed presence of abdominal aortic aneurysm and right common iliac artery aneurysm.  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 Patient needs open resection and grafting of aneurysm. No evidence of coronary artery disease by nuclear stress testing Discussed this with patient and his son the interpreter including risks involved and they would like to proceed with surgery    Plan:     Schedule Wednesday, January 13 open resection and grafting abdominal aortic aneurysm and right common iliac artery aneurysm with aortic biiliac graft. Wrists thoroughly discussed with patient and would like to proceed

## 2014-07-29 ENCOUNTER — Encounter: Payer: Self-pay | Admitting: Cardiology

## 2014-08-04 ENCOUNTER — Encounter (HOSPITAL_COMMUNITY): Payer: Self-pay | Admitting: *Deleted

## 2014-08-04 MED ORDER — DEXTROSE 5 % IV SOLN
1.5000 g | INTRAVENOUS | Status: DC
  Filled 2014-08-04 (×2): qty 1.5

## 2014-08-04 MED ORDER — CHLORHEXIDINE GLUCONATE CLOTH 2 % EX PADS: 6.0000 | MEDICATED_PAD | Freq: Once | CUTANEOUS | Status: DC

## 2014-08-04 MED ORDER — MUPIROCIN 2 % EX OINT
1.0000 "application " | TOPICAL_OINTMENT | Freq: Once | CUTANEOUS | Status: AC
  Administered 2014-08-05: 1 via TOPICAL
  Filled 2014-08-04: qty 22

## 2014-08-04 MED ORDER — SODIUM CHLORIDE 0.9 % IV SOLN: INTRAVENOUS | Status: DC

## 2014-08-04 NOTE — Progress Notes (Signed)
I call patient using interpreter # [phone]. Patient through interper asked to have son speak to me.  I completed interview with son.  Son, Frederick [name] is aware that intetpreter will be here in am and he is ok with this.

## 2014-08-05 ENCOUNTER — Inpatient Hospital Stay (HOSPITAL_COMMUNITY): Payer: Medicare HMO | Admitting: Anesthesiology

## 2014-08-05 ENCOUNTER — Encounter (HOSPITAL_COMMUNITY)
Admission: RE | Disposition: A | Discharge status: Home Health | Payer: Medicare HMO | Source: Ambulatory Visit | Attending: Vascular Surgery

## 2014-08-05 ENCOUNTER — Encounter (HOSPITAL_COMMUNITY): Payer: Self-pay | Admitting: *Deleted

## 2014-08-05 ENCOUNTER — Inpatient Hospital Stay (HOSPITAL_COMMUNITY)
Admission: RE | Admit: 2014-08-05 | Discharge: 2014-08-10 | DRG: 269 | Disposition: A | Discharge status: Home Health | Payer: Medicare HMO | Source: Ambulatory Visit | Attending: Vascular Surgery | Admitting: Vascular Surgery

## 2014-08-05 ENCOUNTER — Inpatient Hospital Stay (HOSPITAL_COMMUNITY): Payer: Medicare HMO

## 2014-08-05 DIAGNOSIS — D62 Acute posthemorrhagic anemia: Secondary | ICD-10-CM | POA: Diagnosis not present

## 2014-08-05 DIAGNOSIS — I723 Aneurysm of iliac artery: Secondary | ICD-10-CM | POA: Diagnosis present

## 2014-08-05 DIAGNOSIS — I714 Abdominal aortic aneurysm, without rupture, unspecified: Secondary | ICD-10-CM | POA: Diagnosis present

## 2014-08-05 DIAGNOSIS — Z419 Encounter for procedure for purposes other than remedying health state, unspecified: Secondary | ICD-10-CM

## 2014-08-05 HISTORY — PX: AORTOILIAC BYPASS: SHX6417

## 2014-08-05 HISTORY — DX: Constipation, unspecified: K59.00

## 2014-08-05 HISTORY — DX: Unspecified osteoarthritis, unspecified site: M19.90

## 2014-08-05 LAB — BLOOD GAS, ARTERIAL
Acid-base deficit: 0.2 mmol/L (ref 0.0–2.0)
Acid-base deficit: 2.7 mmol/L — ABNORMAL HIGH (ref 0.0–2.0)
BICARBONATE: 23.9 meq/L (ref 20.0–24.0)
BICARBONATE: 22.2 meq/L (ref 20.0–24.0)
DRAWN BY: 4228831
FIO2: 0.21 %
O2 Content: 6 L/min
O2 SAT: 96.4 %
O2 Saturation: 98.5 %
PH ART: 7.347 — AB (ref 7.350–7.450)
PO2 ART: 82.5 mmHg (ref 80.0–100.0)
Patient temperature: 98.6
Patient temperature: 97.3
TCO2: 25.1 mmol/L (ref 0–100)
TCO2: 23.5 mmol/L (ref 0–100)
pCO2 arterial: 39.3 mmHg (ref 35.0–45.0)
pCO2 arterial: 41.1 mmHg (ref 35.0–45.0)
pH, Arterial: 7.402 (ref 7.350–7.450)
pO2, Arterial: 138 mmHg — ABNORMAL HIGH (ref 80.0–100.0)

## 2014-08-05 LAB — CBC
HCT: 37 % — ABNORMAL LOW (ref 39.0–52.0)
HEMATOCRIT: 29.4 % — AB (ref 39.0–52.0)
HEMOGLOBIN: 9.9 g/dL — AB (ref 13.0–17.0)
Hemoglobin: 12.2 g/dL — ABNORMAL LOW (ref 13.0–17.0)
MCH: 29 pg (ref 26.0–34.0)
MCH: 29.6 pg (ref 26.0–34.0)
MCHC: 33 g/dL (ref 30.0–36.0)
MCHC: 33.7 g/dL (ref 30.0–36.0)
MCV: 88.1 fL (ref 78.0–100.0)
MCV: 88 fL (ref 78.0–100.0)
PLATELETS: 168 10*3/uL (ref 150–400)
Platelets: 125 10*3/uL — ABNORMAL LOW (ref 150–400)
RBC: 4.2 MIL/uL — ABNORMAL LOW (ref 4.22–5.81)
RBC: 3.34 MIL/uL — ABNORMAL LOW (ref 4.22–5.81)
RDW: 14 % (ref 11.5–15.5)
RDW: 13.8 % (ref 11.5–15.5)
WBC: 4.2 10*3/uL (ref 4.0–10.5)
WBC: 10 10*3/uL (ref 4.0–10.5)

## 2014-08-05 LAB — URINALYSIS, ROUTINE W REFLEX MICROSCOPIC
Bilirubin Urine: NEGATIVE
GLUCOSE, UA: NEGATIVE mg/dL
KETONES UR: NEGATIVE mg/dL
Leukocytes, UA: NEGATIVE
Nitrite: NEGATIVE
PROTEIN: NEGATIVE mg/dL
Specific Gravity, Urine: 1.018 (ref 1.005–1.030)
Urobilinogen, UA: 0.2 mg/dL (ref 0.0–1.0)
pH: 7.5 (ref 5.0–8.0)

## 2014-08-05 LAB — BASIC METABOLIC PANEL
Anion gap: 7 (ref 5–15)
BUN: 23 mg/dL (ref 6–23)
CHLORIDE: 108 meq/L (ref 96–112)
CO2: 23 mmol/L (ref 19–32)
CREATININE: 1.41 mg/dL — AB (ref 0.50–1.35)
Calcium: 8.3 mg/dL — ABNORMAL LOW (ref 8.4–10.5)
GFR calc Af Amer: 54 mL/min — ABNORMAL LOW (ref >90)
GFR, EST NON AFRICAN AMERICAN: 47 mL/min — AB (ref >90)
Glucose, Bld: 138 mg/dL — ABNORMAL HIGH (ref 70–99)
Potassium: 4.1 mmol/L (ref 3.5–5.1)
Sodium: 138 mmol/L (ref 135–145)

## 2014-08-05 LAB — APTT
APTT: 32 s (ref 24–37)
aPTT: 31 seconds (ref 24–37)

## 2014-08-05 LAB — COMPREHENSIVE METABOLIC PANEL
ALK PHOS: 21 U/L — AB (ref 39–117)
ALT: 10 U/L (ref 0–53)
AST: 19 U/L (ref 0–37)
Albumin: 4 g/dL (ref 3.5–5.2)
Anion gap: 8 (ref 5–15)
BILIRUBIN TOTAL: 0.7 mg/dL (ref 0.3–1.2)
BUN: 25 mg/dL — AB (ref 6–23)
CO2: 23 mmol/L (ref 19–32)
Calcium: 9.2 mg/dL (ref 8.4–10.5)
Chloride: 103 mEq/L (ref 96–112)
Creatinine, Ser: 1.46 mg/dL — ABNORMAL HIGH (ref 0.50–1.35)
GFR calc Af Amer: 52 mL/min — ABNORMAL LOW (ref >90)
GFR, EST NON AFRICAN AMERICAN: 45 mL/min — AB (ref >90)
GLUCOSE: 99 mg/dL (ref 70–99)
POTASSIUM: 3.8 mmol/L (ref 3.5–5.1)
Sodium: 134 mmol/L — ABNORMAL LOW (ref 135–145)
Total Protein: 6.9 g/dL (ref 6.0–8.3)

## 2014-08-05 LAB — GLUCOSE, CAPILLARY
GLUCOSE-CAPILLARY: 202 mg/dL — AB (ref 70–99)
GLUCOSE-CAPILLARY: 188 mg/dL — AB (ref 70–99)
Glucose-Capillary: 90 mg/dL (ref 70–99)
Glucose-Capillary: 122 mg/dL — ABNORMAL HIGH (ref 70–99)
Glucose-Capillary: 179 mg/dL — ABNORMAL HIGH (ref 70–99)

## 2014-08-05 LAB — URINE MICROSCOPIC-ADD ON

## 2014-08-05 LAB — MAGNESIUM: Magnesium: 1.9 mg/dL (ref 1.5–2.5)

## 2014-08-05 LAB — PROTIME-INR
INR: 1.12 (ref 0.00–1.49)
INR: 1.4 (ref 0.00–1.49)
PROTHROMBIN TIME: 14.5 s (ref 11.6–15.2)
PROTHROMBIN TIME: 17.3 s — AB (ref 11.6–15.2)

## 2014-08-05 LAB — PREPARE RBC (CROSSMATCH)

## 2014-08-05 LAB — SURGICAL PCR SCREEN
MRSA, PCR: NEGATIVE
Staphylococcus aureus: NEGATIVE

## 2014-08-05 SURGERY — CREATION, BYPASS, ARTERIAL, AORTA TO ILIAC, USING GRAFT
Anesthesia: General | Site: Abdomen

## 2014-08-05 MED ORDER — NEOSTIGMINE METHYLSULFATE 10 MG/10ML IV SOLN
INTRAVENOUS | Status: DC | PRN
  Administered 2014-08-05: 5 mg via INTRAVENOUS

## 2014-08-05 MED ORDER — DEXTROSE 5 % IV SOLN
1.5000 g | INTRAVENOUS | Status: DC | PRN
  Administered 2014-08-05: 1.5 g via INTRAVENOUS

## 2014-08-05 MED ORDER — ROCURONIUM BROMIDE 50 MG/5ML IV SOLN
INTRAVENOUS | Status: AC
  Filled 2014-08-05: qty 2

## 2014-08-05 MED ORDER — KETAMINE HCL 100 MG/ML IJ SOLN
INTRAMUSCULAR | Status: AC
  Filled 2014-08-05: qty 1

## 2014-08-05 MED ORDER — SUFENTANIL CITRATE 50 MCG/ML IV SOLN
INTRAVENOUS | Status: AC
  Filled 2014-08-05: qty 1

## 2014-08-05 MED ORDER — MIDAZOLAM HCL 5 MG/5ML IJ SOLN
INTRAMUSCULAR | Status: DC | PRN
  Administered 2014-08-05 (×2): 2 mg via INTRAVENOUS

## 2014-08-05 MED ORDER — ACETAMINOPHEN 650 MG RE SUPP: 325.0000 mg | RECTAL | Status: DC | PRN

## 2014-08-05 MED ORDER — SODIUM CHLORIDE 0.9 % IV SOLN
500.0000 mg | INTRAVENOUS | Status: DC | PRN
  Administered 2014-08-05: 10 ug/kg/min via INTRAVENOUS

## 2014-08-05 MED ORDER — ALBUMIN HUMAN 5 % IV SOLN
INTRAVENOUS | Status: DC | PRN
  Administered 2014-08-05 (×2): via INTRAVENOUS

## 2014-08-05 MED ORDER — MANNITOL 25 % IV SOLN
INTRAVENOUS | Status: DC | PRN
  Administered 2014-08-05: 25 g via INTRAVENOUS

## 2014-08-05 MED ORDER — MUPIROCIN 2 % EX OINT
TOPICAL_OINTMENT | CUTANEOUS | Status: AC
  Filled 2014-08-05: qty 22

## 2014-08-05 MED ORDER — INSULIN ASPART 100 UNIT/ML ~~LOC~~ SOLN
0.0000 [IU] | Freq: Three times a day (TID) | SUBCUTANEOUS | Status: DC
Start: 2014-08-05 — End: 2014-08-06
  Administered 2014-08-05: 2 [IU] via SUBCUTANEOUS
  Administered 2014-08-06: 1 [IU] via SUBCUTANEOUS

## 2014-08-05 MED ORDER — CHLORHEXIDINE GLUCONATE 0.12 % MT SOLN
15.0000 mL | Freq: Two times a day (BID) | OROMUCOSAL | Status: DC
  Administered 2014-08-05 – 2014-08-09 (×7): 15 mL via OROMUCOSAL
  Filled 2014-08-05 (×12): qty 15

## 2014-08-05 MED ORDER — LABETALOL HCL 5 MG/ML IV SOLN
10.0000 mg | INTRAVENOUS | Status: DC | PRN
  Filled 2014-08-05: qty 4

## 2014-08-05 MED ORDER — ONDANSETRON HCL 4 MG/2ML IJ SOLN: 4.0000 mg | Freq: Four times a day (QID) | INTRAMUSCULAR | Status: DC | PRN

## 2014-08-05 MED ORDER — HYDROMORPHONE HCL 1 MG/ML IJ SOLN
0.2500 mg | INTRAMUSCULAR | Status: DC | PRN
  Administered 2014-08-05 (×3): 0.5 mg via INTRAVENOUS

## 2014-08-05 MED ORDER — HEPARIN SODIUM (PORCINE) 1000 UNIT/ML IJ SOLN
INTRAMUSCULAR | Status: AC
  Filled 2014-08-05: qty 1

## 2014-08-05 MED ORDER — GUAIFENESIN-DM 100-10 MG/5ML PO SYRP: 15.0000 mL | ORAL_SOLUTION | ORAL | Status: DC | PRN

## 2014-08-05 MED ORDER — HYDROMORPHONE HCL 1 MG/ML IJ SOLN
INTRAMUSCULAR | Status: AC
  Administered 2014-08-05: 0.5 mg via INTRAVENOUS
  Filled 2014-08-05: qty 1

## 2014-08-05 MED ORDER — ACETAMINOPHEN 160 MG/5ML PO SOLN
325.0000 mg | ORAL | Status: DC | PRN
  Filled 2014-08-05: qty 20.3

## 2014-08-05 MED ORDER — OXYCODONE HCL 5 MG/5ML PO SOLN
5.0000 mg | Freq: Once | ORAL | Status: DC | PRN
Start: 2014-08-05 — End: 2014-08-05

## 2014-08-05 MED ORDER — PROPOFOL 10 MG/ML IV BOLUS
INTRAVENOUS | Status: AC
  Filled 2014-08-05: qty 20

## 2014-08-05 MED ORDER — HEPARIN SODIUM (PORCINE) 1000 UNIT/ML IJ SOLN
INTRAMUSCULAR | Status: DC | PRN
  Administered 2014-08-05: 6000 [IU] via INTRAVENOUS

## 2014-08-05 MED ORDER — POTASSIUM CHLORIDE CRYS ER 20 MEQ PO TBCR: 20.0000 meq | EXTENDED_RELEASE_TABLET | Freq: Once | ORAL | Status: AC | PRN

## 2014-08-05 MED ORDER — OXYCODONE HCL 5 MG PO TABS: 5.0000 mg | ORAL_TABLET | Freq: Once | ORAL | Status: DC | PRN

## 2014-08-05 MED ORDER — PANTOPRAZOLE SODIUM 40 MG IV SOLR
40.0000 mg | Freq: Every day | INTRAVENOUS | Status: DC
Start: 2014-08-05 — End: 2014-08-09
  Administered 2014-08-05 – 2014-08-08 (×4): 40 mg via INTRAVENOUS
  Filled 2014-08-05 (×5): qty 40

## 2014-08-05 MED ORDER — SODIUM CHLORIDE 0.9 % IV SOLN: 500.0000 mL | Freq: Once | INTRAVENOUS | Status: AC | PRN

## 2014-08-05 MED ORDER — SODIUM CHLORIDE 0.9 % IR SOLN
Status: DC | PRN
  Administered 2014-08-05: 500 mL

## 2014-08-05 MED ORDER — PHENYLEPHRINE 40 MCG/ML (10ML) SYRINGE FOR IV PUSH (FOR BLOOD PRESSURE SUPPORT)
PREFILLED_SYRINGE | INTRAVENOUS | Status: AC
  Filled 2014-08-05: qty 10

## 2014-08-05 MED ORDER — ACETAMINOPHEN 325 MG PO TABS: 325.0000 mg | ORAL_TABLET | ORAL | Status: DC | PRN

## 2014-08-05 MED ORDER — EPHEDRINE SULFATE 50 MG/ML IJ SOLN
INTRAMUSCULAR | Status: AC
  Filled 2014-08-05: qty 2

## 2014-08-05 MED ORDER — 0.9 % SODIUM CHLORIDE (POUR BTL) OPTIME
TOPICAL | Status: DC | PRN
Start: 2014-08-05 — End: 2014-08-05
  Administered 2014-08-05: 3000 mL

## 2014-08-05 MED ORDER — DOCUSATE SODIUM 100 MG PO CAPS
100.0000 mg | ORAL_CAPSULE | Freq: Every day | ORAL | Status: DC
  Administered 2014-08-07 – 2014-08-10 (×4): 100 mg via ORAL
  Filled 2014-08-05 (×5): qty 1

## 2014-08-05 MED ORDER — SUCCINYLCHOLINE CHLORIDE 20 MG/ML IJ SOLN
INTRAMUSCULAR | Status: AC
  Filled 2014-08-05: qty 1

## 2014-08-05 MED ORDER — GLYCOPYRROLATE 0.2 MG/ML IJ SOLN
INTRAMUSCULAR | Status: AC
  Filled 2014-08-05: qty 4

## 2014-08-05 MED ORDER — ENOXAPARIN SODIUM 30 MG/0.3ML ~~LOC~~ SOLN
30.0000 mg | SUBCUTANEOUS | Status: DC
  Administered 2014-08-06 – 2014-08-09 (×4): 30 mg via SUBCUTANEOUS
  Filled 2014-08-05 (×5): qty 0.3

## 2014-08-05 MED ORDER — KCL IN DEXTROSE-NACL 20-5-0.45 MEQ/L-%-% IV SOLN
INTRAVENOUS | Status: AC
  Administered 2014-08-05: 1000 mL via INTRAVENOUS
  Filled 2014-08-05: qty 1000

## 2014-08-05 MED ORDER — FENTANYL CITRATE 0.05 MG/ML IJ SOLN
INTRAMUSCULAR | Status: DC | PRN
  Administered 2014-08-05 (×3): 100 ug via INTRAVENOUS

## 2014-08-05 MED ORDER — HYDRALAZINE HCL 20 MG/ML IJ SOLN: 5.0000 mg | INTRAMUSCULAR | Status: DC | PRN

## 2014-08-05 MED ORDER — PHENOL 1.4 % MT LIQD: 1.0000 | OROMUCOSAL | Status: DC | PRN

## 2014-08-05 MED ORDER — ROCURONIUM BROMIDE 100 MG/10ML IV SOLN
INTRAVENOUS | Status: DC | PRN
  Administered 2014-08-05 (×2): 20 mg via INTRAVENOUS
  Administered 2014-08-05: 60 mg via INTRAVENOUS

## 2014-08-05 MED ORDER — LIDOCAINE HCL (CARDIAC) 20 MG/ML IV SOLN
INTRAVENOUS | Status: DC | PRN
  Administered 2014-08-05: 60 mg via INTRAVENOUS

## 2014-08-05 MED ORDER — FENTANYL CITRATE 0.05 MG/ML IJ SOLN
INTRAMUSCULAR | Status: AC
  Filled 2014-08-05: qty 5

## 2014-08-05 MED ORDER — METOPROLOL TARTRATE 1 MG/ML IV SOLN: 2.0000 mg | INTRAVENOUS | Status: DC | PRN

## 2014-08-05 MED ORDER — SUFENTANIL CITRATE 50 MCG/ML IV SOLN
50.0000 ug | INTRAVENOUS | Status: DC | PRN
  Administered 2014-08-05: .25 ug/kg/h via INTRAVENOUS

## 2014-08-05 MED ORDER — KCL IN DEXTROSE-NACL 20-5-0.45 MEQ/L-%-% IV SOLN
INTRAVENOUS | Status: DC
  Administered 2014-08-05: 1000 mL via INTRAVENOUS
  Administered 2014-08-06 (×3): via INTRAVENOUS
  Administered 2014-08-07: 100 mL/h via INTRAVENOUS
  Administered 2014-08-07 – 2014-08-09 (×4): via INTRAVENOUS
  Filled 2014-08-05 (×16): qty 1000

## 2014-08-05 MED ORDER — CETYLPYRIDINIUM CHLORIDE 0.05 % MT LIQD
7.0000 mL | Freq: Two times a day (BID) | OROMUCOSAL | Status: DC
  Administered 2014-08-05 – 2014-08-07 (×4): 7 mL via OROMUCOSAL

## 2014-08-05 MED ORDER — SODIUM CHLORIDE 0.9 % IJ SOLN
INTRAMUSCULAR | Status: AC
  Filled 2014-08-05: qty 10

## 2014-08-05 MED ORDER — PROTAMINE SULFATE 10 MG/ML IV SOLN
INTRAVENOUS | Status: DC | PRN
  Administered 2014-08-05: 30 mg via INTRAVENOUS
  Administered 2014-08-05 (×2): 20 mg via INTRAVENOUS
  Administered 2014-08-05: 10 mg via INTRAVENOUS
  Administered 2014-08-05: 20 mg via INTRAVENOUS

## 2014-08-05 MED ORDER — MAGNESIUM SULFATE 2 GM/50ML IV SOLN: 2.0000 g | Freq: Once | INTRAVENOUS | Status: AC | PRN

## 2014-08-05 MED ORDER — ACETAMINOPHEN 10 MG/ML IV SOLN
INTRAVENOUS | Status: AC
  Filled 2014-08-05: qty 100

## 2014-08-05 MED ORDER — EPHEDRINE SULFATE 50 MG/ML IJ SOLN
INTRAMUSCULAR | Status: AC
  Filled 2014-08-05: qty 1

## 2014-08-05 MED ORDER — LIDOCAINE HCL (CARDIAC) 20 MG/ML IV SOLN
INTRAVENOUS | Status: AC
  Filled 2014-08-05: qty 5

## 2014-08-05 MED ORDER — LACTATED RINGERS IV SOLN
INTRAVENOUS | Status: DC | PRN
  Administered 2014-08-05 (×3): via INTRAVENOUS

## 2014-08-05 MED ORDER — EPHEDRINE SULFATE 50 MG/ML IJ SOLN
INTRAMUSCULAR | Status: DC | PRN
  Administered 2014-08-05: 10 mg via INTRAVENOUS
  Administered 2014-08-05 (×2): 5 mg via INTRAVENOUS

## 2014-08-05 MED ORDER — ACETAMINOPHEN 10 MG/ML IV SOLN
INTRAVENOUS | Status: DC | PRN
  Administered 2014-08-05: 1000 mg via INTRAVENOUS

## 2014-08-05 MED ORDER — DEXTROSE 5 % IV SOLN
10.0000 mg | INTRAVENOUS | Status: DC | PRN
  Administered 2014-08-05: 20 ug/min via INTRAVENOUS

## 2014-08-05 MED ORDER — PANTOPRAZOLE SODIUM 40 MG PO TBEC
40.0000 mg | DELAYED_RELEASE_TABLET | Freq: Every day | ORAL | Status: DC
  Administered 2014-08-07 – 2014-08-10 (×4): 40 mg via ORAL
  Filled 2014-08-05 (×4): qty 1

## 2014-08-05 MED ORDER — ALUM & MAG HYDROXIDE-SIMETH 200-200-20 MG/5ML PO SUSP: 15.0000 mL | ORAL | Status: DC | PRN

## 2014-08-05 MED ORDER — NEOSTIGMINE METHYLSULFATE 10 MG/10ML IV SOLN
INTRAVENOUS | Status: AC
Start: 2014-08-05 — End: 2014-08-05
  Filled 2014-08-05: qty 1

## 2014-08-05 MED ORDER — GLYCOPYRROLATE 0.2 MG/ML IJ SOLN
INTRAMUSCULAR | Status: DC | PRN
  Administered 2014-08-05: .8 mg via INTRAVENOUS

## 2014-08-05 MED ORDER — DEXTROSE 5 % IV SOLN
1.5000 g | Freq: Two times a day (BID) | INTRAVENOUS | Status: AC
  Administered 2014-08-05 – 2014-08-06 (×2): 1.5 g via INTRAVENOUS
  Filled 2014-08-05 (×2): qty 1.5

## 2014-08-05 MED ORDER — MIDAZOLAM HCL 2 MG/2ML IJ SOLN
INTRAMUSCULAR | Status: AC
  Filled 2014-08-05: qty 2

## 2014-08-05 MED ORDER — PROPOFOL 10 MG/ML IV BOLUS
INTRAVENOUS | Status: DC | PRN
  Administered 2014-08-05: 80 mg via INTRAVENOUS

## 2014-08-05 MED ORDER — MORPHINE SULFATE 2 MG/ML IJ SOLN
2.0000 mg | INTRAMUSCULAR | Status: DC | PRN
  Administered 2014-08-06 (×2): 2 mg via INTRAVENOUS
  Administered 2014-08-06: 4 mg via INTRAVENOUS
  Administered 2014-08-06 (×4): 2 mg via INTRAVENOUS
  Administered 2014-08-06 – 2014-08-07 (×5): 4 mg via INTRAVENOUS
  Filled 2014-08-05 (×3): qty 1
  Filled 2014-08-05 (×2): qty 2
  Filled 2014-08-05: qty 1
  Filled 2014-08-05 (×4): qty 2
  Filled 2014-08-05: qty 1
  Filled 2014-08-05: qty 2

## 2014-08-05 MED FILL — Heparin Sodium (Porcine) Inj 1000 Unit/ML: INTRAMUSCULAR | Qty: 30 | Status: AC

## 2014-08-05 MED FILL — Sodium Chloride Irrigation Soln 0.9%: Qty: 3000 | Status: AC

## 2014-08-05 MED FILL — Sodium Chloride IV Soln 0.9%: INTRAVENOUS | Qty: 1000 | Status: AC

## 2014-08-05 SURGICAL SUPPLY — 59 items
ATTRACTOMAT 16X20 MAGNETIC DRP (DRAPES) ×3 IMPLANT
BLADE CLIPPER SURG (BLADE) ×3 IMPLANT
CANISTER SUCTION 2500CC (MISCELLANEOUS) ×3 IMPLANT
CLIP TI MEDIUM 24 (CLIP) ×3 IMPLANT
CLIP TI WIDE RED SMALL 24 (CLIP) ×3 IMPLANT
COVER SURGICAL LIGHT HANDLE (MISCELLANEOUS) ×3 IMPLANT
DRSG COVADERM 4X14 (GAUZE/BANDAGES/DRESSINGS) ×3 IMPLANT
ELECT BLADE 6.5 EXT (BLADE) ×3 IMPLANT
ELECT REM PT RETURN 9FT ADLT (ELECTROSURGICAL) ×6
ELECTRODE REM PT RTRN 9FT ADLT (ELECTROSURGICAL) ×2 IMPLANT
GLOVE BIOGEL M 6.5 STRL (GLOVE) ×6 IMPLANT
GLOVE BIOGEL PI IND STRL 6.5 (GLOVE) ×4 IMPLANT
GLOVE BIOGEL PI IND STRL 8 (GLOVE) ×1 IMPLANT
GLOVE BIOGEL PI INDICATOR 6.5 (GLOVE) ×8
GLOVE BIOGEL PI INDICATOR 8 (GLOVE) ×2
GLOVE SS BIOGEL STRL SZ 7 (GLOVE) ×2 IMPLANT
GLOVE SS BIOGEL STRL SZ 7.5 (GLOVE) ×1 IMPLANT
GLOVE SUPERSENSE BIOGEL SZ 7 (GLOVE) ×4
GLOVE SUPERSENSE BIOGEL SZ 7.5 (GLOVE) ×2
GLOVE SURG SS PI 7.0 STRL IVOR (GLOVE) ×3 IMPLANT
GLOVE SURG SS PI 7.5 STRL IVOR (GLOVE) ×3 IMPLANT
GOWN STRL REUS W/ TWL LRG LVL3 (GOWN DISPOSABLE) ×6 IMPLANT
GOWN STRL REUS W/ TWL XL LVL3 (GOWN DISPOSABLE) ×2 IMPLANT
GOWN STRL REUS W/TWL LRG LVL3 (GOWN DISPOSABLE) ×12
GOWN STRL REUS W/TWL XL LVL3 (GOWN DISPOSABLE) ×4
GRAFT HEMASHIELD 14X8MM (Vascular Products) ×3 IMPLANT
INSERT FOGARTY 61MM (MISCELLANEOUS) ×3 IMPLANT
INSERT FOGARTY SM (MISCELLANEOUS) ×12 IMPLANT
KIT BASIN OR (CUSTOM PROCEDURE TRAY) ×3 IMPLANT
KIT ROOM TURNOVER OR (KITS) ×3 IMPLANT
NS IRRIG 1000ML POUR BTL (IV SOLUTION) ×9 IMPLANT
PACK AORTA (CUSTOM PROCEDURE TRAY) ×3 IMPLANT
PAD ARMBOARD 7.5X6 YLW CONV (MISCELLANEOUS) ×6 IMPLANT
SPONGE LAP 18X18 X RAY DECT (DISPOSABLE) ×3 IMPLANT
SPONGE LAP 4X18 X RAY DECT (DISPOSABLE) ×3 IMPLANT
STAPLER VISISTAT 35W (STAPLE) ×6 IMPLANT
SUT ETHIBOND 5 LR DA (SUTURE) ×3 IMPLANT
SUT PROLENE 1 XLH 60 (SUTURE) ×6 IMPLANT
SUT PROLENE 3 0 SH1 36 (SUTURE) ×6 IMPLANT
SUT PROLENE 4 0 RB 1 (SUTURE)
SUT PROLENE 4-0 RB1 .5 CRCL 36 (SUTURE) IMPLANT
SUT PROLENE 5 0 C1 (SUTURE) ×3 IMPLANT
SUT PROLENE 5 0 CC 1 (SUTURE) ×12 IMPLANT
SUT PROLENE 6 0 C 1 30 (SUTURE) IMPLANT
SUT SILK 2 0 (SUTURE)
SUT SILK 2 0 SH CR/8 (SUTURE) ×3 IMPLANT
SUT SILK 2 0 TIES 17X18 (SUTURE)
SUT SILK 2-0 18XBRD TIE 12 (SUTURE) IMPLANT
SUT SILK 2-0 18XBRD TIE BLK (SUTURE) IMPLANT
SUT SILK 3 0 (SUTURE) ×4
SUT SILK 3 0 TIES 17X18 (SUTURE) ×2
SUT SILK 3-0 18XBRD TIE 12 (SUTURE) ×2 IMPLANT
SUT SILK 3-0 18XBRD TIE BLK (SUTURE) ×1 IMPLANT
SUT VIC AB 2-0 CTX 36 (SUTURE) IMPLANT
SUT VIC AB 3-0 MH 27 (SUTURE) ×9 IMPLANT
SUT VIC AB 3-0 SH 27 (SUTURE) ×4
SUT VIC AB 3-0 SH 27X BRD (SUTURE) ×2 IMPLANT
TRAY FOLEY CATH 16FRSI W/METER (SET/KITS/TRAYS/PACK) ×3 IMPLANT
WATER STERILE IRR 1000ML POUR (IV SOLUTION) ×6 IMPLANT

## 2014-08-05 NOTE — Progress Notes (Signed)
Attempted to call report to 2S, RN unavailable at this time.

## 2014-08-05 NOTE — Anesthesia Procedure Notes (Signed)
Procedures

## 2014-08-05 NOTE — H&P (View-Only) (Signed)
Subjective:     Patient ID: Frederick [name], male   DOB: 08/07/1935, 78 y.o.   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 He does not speak English and is accompanied by his son and an a Korean interpreter. Patient had unsuccessful attempts at embolization of right internal iliac artery on 2 occasions by Dr. Chen. He will require open surgery for the abdominal aneurysm repair. Today's visit was to answer all questions and further discuss the operative procedure which required translation with the interpreter.  Past Medical History  Diagnosis Date  . Diabetes mellitus without complication   . Hyperlipidemia   . Hypertension     History  Substance Use Topics  . Smoking status: Former Smoker -- 28 years    Quit date: 05/26/2004  . Smokeless tobacco: Not on file  . Alcohol Use: Not on file    Family History  Problem Relation Age of Onset  . Family history unknown: Yes    No Known Allergies  Current outpatient prescriptions: fenofibrate (TRICOR) 145 MG tablet, Take 145 mg by mouth daily. , Disp: , Rfl: ;  lisinopril (PRINIVIL,ZESTRIL) 5 MG tablet, Take 5 mg by mouth daily. , Disp: , Rfl: ;  metFORMIN (GLUCOPHAGE-XR) 500 MG 24 hr tablet, Take 1 tablet (500 mg total) by mouth daily. Do not restart this medication until Sunday 07/05/14., Disp: 30 tablet, Rfl: 0 omega-3 acid ethyl esters (LOVAZA) 1 G capsule, Take 1 g by mouth 2 (two) times daily. , Disp: , Rfl: ;  oxyCODONE-acetaminophen (PERCOCET/ROXICET) 5-325 MG per tablet, Take 1 tablet by mouth every 6 (six) hours as needed for severe pain., Disp: 20 tablet, Rfl: 0;  pravastatin (PRAVACHOL) 20 MG tablet, Take 20 mg by mouth daily. , Disp: , Rfl:   BP 129/71 mmHg  Pulse 78  Ht 5' 10" (1.778 m)  Wt 157 lb (71.215 kg)  BMI 22.53 kg/m2  SpO2 100%  Body mass index is 22.53 kg/(m^2).           Review of Systems denies chest pain, dyspnea on  exertion, PND, orthopnea, hemoptysis.    Objective:   Physical Exam BP 129/71 mmHg  Pulse 78  Ht 5' 10" (1.778 m)  Wt 157 lb (71.215 kg)  BMI 22.53 kg/m2  SpO2 100%  Gen.-alert and oriented x3 in no apparent distress HEENT normal for age Lungs no rhonchi or wheezing Cardiovascular regular rhythm no murmurs carotid pulses 3+ palpable no bruits audible Abdomen soft nontender 5-6 cm pulsatile mass in mid abdomen  Musculoskeletal free of  major deformities Skin clear -no rashes Neurologic normal Lower extremities 3+ femoral and dorsalis pedis pulses palpable bilaterally with no edema  CT scan was reviewed by computer today as well as the results assessed. This confirmed presence of abdominal aortic aneurysm and right common iliac artery aneurysm.  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 Patient needs open resection and grafting of aneurysm. No evidence of coronary artery disease by nuclear stress testing Discussed this with patient and his son the interpreter including risks involved and they would like to proceed with surgery    Plan:     Schedule Wednesday, January 13 open resection and grafting abdominal aortic aneurysm and right common iliac artery aneurysm with aortic biiliac graft. Wrists thoroughly discussed with patient and would like to proceed      

## 2014-08-05 NOTE — Transfer of Care (Signed)
Immediate Anesthesia Transfer of Care Note  Patient: Frederick [name]  Procedure(s) Performed: Procedure(s): RESECTION AND GRAFTING OF ABDOMINAL AORTIC ANEURYSM AND INSERTION OF AORTA TO RIGHT EXTERNAL ILIAC & LEFT COMMON ILIAC WITH 14X 8 MM X40 CM HEMASHIELD GRAFT (N/A)  Patient Location: PACU  Anesthesia Type:General  Level of Consciousness: awake, alert  and oriented  Airway & Oxygen Therapy: Patient Spontanous Breathing and Patient connected to face mask oxygen  Post-op Assessment: Report given to PACU RN and Post -op Vital signs reviewed and stable  Post vital signs: Reviewed and stable  Complications: No apparent anesthesia complications

## 2014-08-05 NOTE — Anesthesia Postprocedure Evaluation (Signed)
  Anesthesia Post-op Note  Patient: Frederick [name]   Procedure(s) Performed: Procedure(s): RESECTION AND GRAFTING OF ABDOMINAL AORTIC ANEURYSM AND INSERTION OF AORTA TO RIGHT EXTERNAL ILIAC & LEFT COMMON ILIAC WITH 14X 8 MM X40 CM HEMASHIELD GRAFT (N/A)  Patient Location: PACU  Anesthesia Type:General  Level of Consciousness: awake and alert   Airway and Oxygen Therapy: Patient Spontanous Breathing  Post-op Pain: mild  Post-op Assessment: Post-op Vital signs reviewed, Patient's Cardiovascular Status Stable, Respiratory Function Stable, Patent Airway, No signs of Nausea or vomiting and Pain level controlled  Post-op Vital Signs: Reviewed and stable  Last Vitals:  Filed Vitals:   08/05/14 1400  BP: 143/60  Pulse: 66  Temp:   Resp: 17    Complications: No apparent anesthesia complications

## 2014-08-05 NOTE — Interval H&P Note (Signed)
History and Physical Interval Note:  08/05/2014 7:55 AM  Frederick [name]  has presented today for surgery, with the diagnosis of Abdominal aortic aneurysm I71.4; Right common iliac artery aneurysm I72.3  The various methods of treatment have been discussed with the patient and family. After consideration of risks, benefits and other options for treatment, the patient has consented to  Procedure(s): RESECTION AND GRAFTING OF ABDOMINAL AORTIC ANEURYSM AND RIGHT COMMON ILIAC ARTERY ANEURYSM (Right) as a surgical intervention .  The patient's history has been reviewed, patient examined, no change in status, stable for surgery.  I have reviewed the patient's chart and labs.  Questions were answered to the patient's satisfaction.     Josephina GipLAWSON, JAMES

## 2014-08-05 NOTE — Anesthesia Preprocedure Evaluation (Signed)
Anesthesia Evaluation  Patient identified by MRN, date of birth, ID band Patient awake    Reviewed: Allergy & Precautions, NPO status , Patient's Chart, lab work & pertinent test results  History of Anesthesia Complications Negative for: history of anesthetic complications  Airway Mallampati: II  TM Distance: >3 FB     Dental  (+) Edentulous Upper, Edentulous Lower   Pulmonary neg shortness of breath, neg COPDneg recent URI, former smoker,  breath sounds clear to auscultation        Cardiovascular hypertension, Pt. on medications and Pt. on home beta blockers - angina+ Peripheral Vascular Disease - Past MI Rhythm:Regular     Neuro/Psych negative neurological ROS     GI/Hepatic negative GI ROS, Neg liver ROS,   Endo/Other  diabetes, Type 2, Oral Hypoglycemic Agents  Renal/GU negative Renal ROS     Musculoskeletal   Abdominal   Peds  Hematology negative hematology ROS (+)   Anesthesia Other Findings   Reproductive/Obstetrics                             Anesthesia Physical Anesthesia Plan  ASA: III  Anesthesia Plan: General   Post-op Pain Management:    Induction: Intravenous  Airway Management Planned: Oral ETT  Additional Equipment: CVP, Arterial line and Ultrasound Guidance Line Placement  Intra-op Plan:   Post-operative Plan: Extubation in OR and Possible Post-op intubation/ventilation  Informed Consent: I have reviewed the patients History and Physical, chart, labs and discussed the procedure including the risks, benefits and alternatives for the proposed anesthesia with the patient or authorized representative who has indicated his/her understanding and acceptance.     Plan Discussed with: Surgeon and CRNA  Anesthesia Plan Comments:         Anesthesia Quick Evaluation

## 2014-08-05 NOTE — Op Note (Signed)
OPERATIVE REPORT  Date of Surgery: 08/05/2014  Surgeon: Josephina GipJames Martavious Hartel, MD  Assistant: Dr. Tawanna Coolerodd early, Doreatha MassedSamantha Rhyne PA  Pre-op Diagnosis: Abdominal aortic aneurysm I71.4; Right common iliac artery aneurysm I72.3  Post-op Diagnosis: Abdominal aortic aneurysm I71.4; Right common iliac artery   Procedure: Procedure(s): RESECTION AND GRAFTING OF ABDOMINAL AORTIC ANEURYSM AND INSERTION OF AORTA TO RIGHT EXTERNAL ILIAC & LEFT COMMON ILIAC WITH 14X 8 MM HEMASHIELD GRAFT  Anesthesia: General  EBL: 300 cc  Complications: None  The patient taken the operating room placed in supine position at which time satisfactory general endotracheal anesthesia was minister. Abdomen and groins were prepped Betadine scrub and solution draped in routine sterile manner. Radial artery monitoring and central venous catheter were inserted by anesthesia preoperatively. Midline incision was made from xiphoid to below the umbilicus carried after subtendinous tissue and linea alba is in the Bovie.  Cavity was entered and thoroughly explored. Stomach duodenal small bowel and colon unremarkable. Liver was smooth no palpable masses. Gallbladder appeared normal O stones were palpable. Transverse colon was elevated intestines reflected the right side exposing in from a renal abdominal aortic aneurysm which extended down to the common iliac arteries. The left common iliac was slightly dilated the right common iliac artery was aneurysmal down to the origin of the internal Iliac arteries. All of this was dissected free and encircled with Vesseloops. The neck of the aneurysm below the renal arteries took an abrupt turn to the right side and then back to the left and this was all dissected free. Patient was then given 25 g mannitol and heparinized. Aorta was occluded distal to the renal arteries the iliac arteries occluded distally with vascular clamps. Aneurysm was opened anteriorly and a moderate amount of laminated thrombus removed.  A few lumbars were oversewn with 2-0 silk figure-of-eight sutures from within. Neck of the aneurysm was transected about 3 cm distal to the renal arteries to eliminate the tortuosity. A 14 x 8 mm Hemashield Dacron graft anastomosed in in the aortic stump using continuous 3-0 Prolene. This was checked for leaks and none were present. On the left side the common iliac artery was transected proximal to the bifurcation about 2 cm. There was good vessel in both arteries were widely patent. And in anastomosis was done with slight spatulation of the graft and this was done 5-0 Prolene. All doing appropriate flushing is is completed left leg open. On the right side the internal iliac artery was ligated at its origin and the external iliac artery was slightly spatulated and in anastomosis was done for the right limb of the graft to the external iliac artery 5-0 Prolene. Following completion of this clamps released nose Pulse in all vessels. Inferior mesenteric artery had good backbleeding was oversewn with 2-0 silk suture. Protamine given to reverse the heparin following adequate hemostasis wounds irrigated with saline and closed in layers by closing the aneurysm sac over the graft with 3-0 Vicryl retroperitoneum with 3-0 Vicryl fascia with #1 Prolene skin with clips sterile dressings applied patient taken to recovery in stable condition extubated. He received 200 cc of blood from the [facility] no bank blood had excellent urinary output was stable hemodynamically.  Procedure Details:   Josephina GipJames Shantasia Hunnell, MD 08/05/2014 11:55 AM

## 2014-08-05 NOTE — Progress Notes (Addendum)
[facility]  S/p open AAA repair  Day of Surgery  Patient seen in PACU. Had some abdominal pain that is well controlled with pain medicine. No other complaints.  General: resting in bed in NAD Cardiac: RRR Lungs: clear to auscultation anterior Abdomen: small amount of drainage on midline abdominal dressing. Abdomen soft. NG tube with sanguinous output MS: moving all extremities equally. Palpable PT pulses bilaterally  Plan Stable post-operatively. To 2S when bed available.   Maris BergerKimberly Trinh, [place] JerseyPA-C Vascular Surgery [phone]  Patient doing well postoperatively with good posterior tibial pulses palpable. Breathing comfortably. Pain well-controlled.

## 2014-08-06 ENCOUNTER — Inpatient Hospital Stay (HOSPITAL_COMMUNITY): Payer: Medicare HMO

## 2014-08-06 LAB — COMPREHENSIVE METABOLIC PANEL
ALK PHOS: 16 U/L — AB (ref 39–117)
ALT: 9 U/L (ref 0–53)
AST: 22 U/L (ref 0–37)
Albumin: 3.2 g/dL — ABNORMAL LOW (ref 3.5–5.2)
Anion gap: 14 (ref 5–15)
BUN: 17 mg/dL (ref 6–23)
CO2: 23 mmol/L (ref 19–32)
CREATININE: 1.36 mg/dL — AB (ref 0.50–1.35)
Calcium: 8.6 mg/dL (ref 8.4–10.5)
Chloride: 103 mEq/L (ref 96–112)
GFR calc Af Amer: 57 mL/min — ABNORMAL LOW (ref >90)
GFR, EST NON AFRICAN AMERICAN: 49 mL/min — AB (ref >90)
GLUCOSE: 174 mg/dL — AB (ref 70–99)
POTASSIUM: 4 mmol/L (ref 3.5–5.1)
Sodium: 140 mmol/L (ref 135–145)
Total Bilirubin: 0.5 mg/dL (ref 0.3–1.2)
Total Protein: 5.4 g/dL — ABNORMAL LOW (ref 6.0–8.3)

## 2014-08-06 LAB — MAGNESIUM: Magnesium: 2.2 mg/dL (ref 1.5–2.5)

## 2014-08-06 LAB — GLUCOSE, CAPILLARY
GLUCOSE-CAPILLARY: 143 mg/dL — AB (ref 70–99)
Glucose-Capillary: 122 mg/dL — ABNORMAL HIGH (ref 70–99)
Glucose-Capillary: 132 mg/dL — ABNORMAL HIGH (ref 70–99)
Glucose-Capillary: 140 mg/dL — ABNORMAL HIGH (ref 70–99)
Glucose-Capillary: 140 mg/dL — ABNORMAL HIGH (ref 70–99)
Glucose-Capillary: 177 mg/dL — ABNORMAL HIGH (ref 70–99)

## 2014-08-06 LAB — BLOOD GAS, ARTERIAL
Acid-base deficit: 1.5 mmol/L (ref 0.0–2.0)
Bicarbonate: 22.4 mEq/L (ref 20.0–24.0)
DRAWN BY: 331761
O2 Content: 1 L/min
O2 SAT: 95.4 %
PATIENT TEMPERATURE: 98.6
PCO2 ART: 35.3 mmHg (ref 35.0–45.0)
PH ART: 7.418 (ref 7.350–7.450)
TCO2: 23.4 mmol/L (ref 0–100)
pO2, Arterial: 76.8 mmHg — ABNORMAL LOW (ref 80.0–100.0)

## 2014-08-06 LAB — CBC
HCT: 28.9 % — ABNORMAL LOW (ref 39.0–52.0)
HEMOGLOBIN: 9.7 g/dL — AB (ref 13.0–17.0)
MCH: 29.3 pg (ref 26.0–34.0)
MCHC: 33.6 g/dL (ref 30.0–36.0)
MCV: 87.3 fL (ref 78.0–100.0)
PLATELETS: 130 10*3/uL — AB (ref 150–400)
RBC: 3.31 MIL/uL — AB (ref 4.22–5.81)
RDW: 14 % (ref 11.5–15.5)
WBC: 8.6 10*3/uL (ref 4.0–10.5)

## 2014-08-06 LAB — AMYLASE: Amylase: 50 U/L (ref 0–105)

## 2014-08-06 MED ORDER — BENZOCAINE 10 % MT GEL
Freq: Four times a day (QID) | OROMUCOSAL | Status: DC | PRN
  Filled 2014-08-06: qty 9.4

## 2014-08-06 MED ORDER — INSULIN ASPART 100 UNIT/ML ~~LOC~~ SOLN
0.0000 [IU] | SUBCUTANEOUS | Status: DC
  Administered 2014-08-06 – 2014-08-07 (×7): 1 [IU] via SUBCUTANEOUS
  Administered 2014-08-08: 2 [IU] via SUBCUTANEOUS
  Administered 2014-08-08 – 2014-08-09 (×2): 1 [IU] via SUBCUTANEOUS

## 2014-08-06 MED ORDER — POLYVINYL ALCOHOL 1.4 % OP SOLN
1.0000 [drp] | OPHTHALMIC | Status: DC | PRN
  Administered 2014-08-06 – 2014-08-07 (×2): 1 [drp] via OPHTHALMIC
  Filled 2014-08-06: qty 15

## 2014-08-06 MED ORDER — BISACODYL 10 MG RE SUPP: 10.0000 mg | Freq: Every day | RECTAL | Status: DC | PRN

## 2014-08-06 NOTE — Plan of Care (Signed)
Problem: Phase II Progression Outcomes Goal: Weaning O2 to maintain Sats > 90% Outcome: Progressing Pt on 2l O2

## 2014-08-06 NOTE — Progress Notes (Addendum)
  AAA Progress Note    08/06/2014 7:29 AM 1 Day Post-Op  Subjective:  C/o abdominal pain and the right side of the inside of his mouth hurting  Afebrile HR 80's NSR 110's-150's systolic (120's systolic since yesterday afternoon) 95% 1LO2NC  Gtts:  none  Filed Vitals:   08/06/14 0700  BP: 128/96  Pulse:   Temp:   Resp: 21    Physical Exam: Cardiac:  regular Lungs:  CTAB Abdomen:  Soft, NT/ND; -BS Incisions:  Midline incision with some bloody drainage on bandage Extremities:  2+ palpable PT pulses bilaterally; bilateral feet are warm  CBC    Component Value Date/Time   WBC 8.6 08/06/2014 0430   RBC 3.31* 08/06/2014 0430   HGB 9.7* 08/06/2014 0430   HCT 28.9* 08/06/2014 0430   PLT 130* 08/06/2014 0430   MCV 87.3 08/06/2014 0430   MCH 29.3 08/06/2014 0430   MCHC 33.6 08/06/2014 0430   RDW 14.0 08/06/2014 0430    BMP pending   Intake/Output Summary (Last 24 hours) at 08/06/14 0729 Last data filed at 08/06/14 0700  Gross per 24 hour  Intake 5238.33 ml  Output   2820 ml  Net 2418.33 ml   NGT output:  90cc total; 50cc overnight  Assessment/Plan:  79 y.o. male is s/p  Resection and grafting of AAA & insertion of aorta to right EIA and left CIA with 14x8 mm hemashield graft 1 Day Post-Op  -pt doing well this am with 2+ palpable PT pulses bilaterally -BMP still pending.  RN has called the lab 3x and I have called them as well.  (BMP was sent to lab at 4am).   -pt's glucose is 170's-180's-pt is diabetic.  May need to change fluids to NS, however, will wait until BMP comes back to check K+. -d/c NGT - keep NPO for now -pt c/o soreness of the right lower side of his mouth that started last night-will try Orajel for relief. -increase mobilization today -dulcolax supp this am    Doreatha MassedSamantha Rhyne, PA-C [facility] [phone] 08/06/2014 7:29 AM  Agree with above assessment Abdomen relatively soft incision looks good 3+ femoral and  posterior tibial pulses palpable bilaterally NG  with minimal drainage  Plan DC NG tube and a line and mobilize patient Transfer tomorrow We'll keep nothing by mouth except occasional ice chips Doing well 1 day post resection grafting abdominal aortic aneurysm right common iliac artery aneurysm.

## 2014-08-06 NOTE — Evaluation (Signed)
Occupational Therapy Evaluation Patient Details Name: Frederick [name] MRN: [id number] DOB: 04/08/1936 Today's Date: 08/06/2014    History of Present Illness s/p AAA repair   Clinical Impression   Per son's report, pt was independent and active prior to admission.  Pt presents with expected abdominal pain, impaired balance, and generalized weakness interfering with ability to perform ADL and ADL transfers.  Will likely progress well in therapy.  Will follow acutely.    Follow Up Recommendations  CIR    Equipment Recommendations   (TBD)    Recommendations for Other Services Rehab consult     Precautions / Restrictions Precautions Precautions: Fall Restrictions Weight Bearing Restrictions: No      Mobility Bed Mobility               General bed mobility comments: pt up in chair  Transfers Overall transfer level: Needs assistance Equipment used: 2 person hand held assist Transfers: Sit to/from Stand Sit to Stand: +2 physical assistance;Min assist         General transfer comment: stood momentarily to place pillow, flexed posture due to pain    Balance                                            ADL Overall ADL's : Needs assistance/impaired Eating/Feeding: Independent;Sitting (ice chips)   Grooming: Wash/dry hands;Wash/dry face;Sitting;Minimal assistance   Upper Body Bathing: Minimal assitance;Sitting   Lower Body Bathing: Minimal assistance;Sit to/from stand   Upper Body Dressing : Minimal assistance;Sitting   Lower Body Dressing: Minimal assistance;Sit to/from stand                 General ADL Comments: Abdominal pain limiting ability to access feet for bathing and dressing.     Vision                     Perception     Praxis      Pertinent Vitals/Pain Pain Assessment: Faces Faces Pain Scale: Hurts little more Pain Location: abdomen Pain Descriptors / Indicators: Operative site guarding;Grimacing Pain  Intervention(s): Limited activity within patient's tolerance;Monitored during session;Repositioned (pt declining pain meds)     Hand Dominance Right   Extremity/Trunk Assessment Upper Extremity Assessment Upper Extremity Assessment: Overall WFL for tasks assessed   Lower Extremity Assessment Lower Extremity Assessment: Defer to PT evaluation       Communication Communication Communication: Prefers language other than [place] ([name])   Cognition Arousal/Alertness: Awake/alert Behavior During Therapy: WFL for tasks assessed/performed Overall Cognitive Status: Difficult to assess                     General Comments       Exercises       Shoulder Instructions      Home Living Family/patient expects to be discharged to:: Private residence Living Arrangements: Spouse/significant other;Children Available Help at Discharge: Family;Available PRN/intermittently (pt will be alone from 8-4:00 as family works) Type of Home: Apartment Home Access: Stairs to enter Secretary/administratorntrance Stairs-Number of Steps: 3   Home Layout: Two level;1/2 bath on main level Alternate Level Stairs-Number of Steps: 1 flight Alternate Level Stairs-Rails: Right Bathroom Shower/Tub: Chief Strategy OfficerTub/shower unit   Bathroom Toilet: Standard     Home Equipment: None          Prior Functioning/Environment Level of Independence: Independent  Comments: pt walked every day, son reports pt as active    OT Diagnosis: Generalized weakness;Acute pain   OT Problem List: Decreased strength;Decreased activity tolerance;Impaired balance (sitting and/or standing);Decreased knowledge of use of DME or AE;Pain   OT Treatment/Interventions: Self-care/ADL training;DME and/or AE instruction;Patient/family education    OT Goals(Current goals can be found in the care plan section) Acute Rehab OT Goals Patient Stated Goal: pt did not state OT Goal Formulation: Patient unable to participate in goal setting Time For Goal  Achievement: 08/20/14 Potential to Achieve Goals: [name] ADL Goals Pt Will Perform Grooming: with modified independence;standing Pt Will Perform Lower Body Bathing: with modified independence;sit to/from stand Pt Will Perform Lower Body Dressing: with modified independence;sit to/from stand Pt Will Transfer to Toilet: with modified independence;ambulating;regular height toilet (determine need for riser/3 in 1) Pt Will Perform Toileting - Clothing Manipulation and hygiene: with modified independence;sit to/from stand Pt Will Perform Tub/Shower Transfer: Tub transfer;ambulating;with supervision;rolling walker (determine need for shower seat)  OT Frequency: Min 2X/week   Barriers to D/C:            Co-evaluation              End of Session Nurse Communication:  (home/family situation)  Activity Tolerance: Patient limited by pain Patient left: with call bell/phone within reach;in chair   Time: 1552-1610 OT Time Calculation (min): 18 min Charges:  OT General Charges $OT Visit: 1 Procedure OT Evaluation $Initial OT Evaluation Tier I: 1 Procedure OT Treatments $Self Care/Home Management : 8-22 mins G-Codes:    Evern Bio 08/06/2014, 4:38 PM [phone]

## 2014-08-06 NOTE — Progress Notes (Signed)
Rehab Admissions Coordinator Note:  Patient was screened by Clois DupesBoyette, Beacher Every Godwin for appropriateness for an Inpatient Acute Rehab Consult per PT and OT recommendation.  At this time, we are recommending Inpatient Rehab consult. Please place order for full assessment for possible admission.  Clois DupesBoyette, Himani Corona Godwin 08/06/2014, 6:36 PM  I can be reached at [phone].

## 2014-08-06 NOTE — Progress Notes (Signed)
UR Completed.  [phone]  

## 2014-08-06 NOTE — Evaluation (Signed)
Physical Therapy Evaluation Patient Details Name: Frederick [name] MRN: [id number] DOB: Jul 02, 1936 Today's Date: 08/06/2014   History of Present Illness  s/p AAA repair  Clinical Impression  Patient is seen following the above procedure and presents with functional limitations due to the deficits listed below (see PT Problem List). Min assist +2 for bed mobility and stand pivot transfer. Unable to ambulate at this time due to fatigue and pain. Pt will be alone during the day as his family works. Based on today's assessment feel pt would benefit from CIR, however may progress to SNF or home health based on progression over the coming days. Will continue to monitor and update accordingly.  Patient will benefit from skilled PT to increase their independence and safety with mobility to allow discharge to the venue listed below.       Follow Up Recommendations CIR    Equipment Recommendations  Rolling walker with 5" wheels;3in1 (PT)    Recommendations for Other Services Rehab consult;OT consult     Precautions / Restrictions Precautions Precautions: Fall Restrictions Weight Bearing Restrictions: No      Mobility  Bed Mobility Overal bed mobility: Needs Assistance;+2 for physical assistance Bed Mobility: Rolling;Sidelying to Sit Rolling: Min assist;+2 for physical assistance Sidelying to sit: Min assist;+2 for physical assistance;HOB elevated       General bed mobility comments: Min assist +2 with education for log roll technique to exit bed. Grimicing with pain. Provided assist for truncal support, able to bring LEs off of bed without assist.  Transfers Overall transfer level: Needs assistance Equipment used: 2 person hand held assist Transfers: Sit to/from Stand;Stand Pivot Transfers Sit to Stand: +2 physical assistance;Min assist Stand pivot transfers: Min assist;+2 physical assistance       General transfer comment: Min assist for boost to stand from lowest bed setting. VC  for technique +2 handheld assist. Able to pivot with min assist for support and balance. No buckling noted. Not responding to cues and facilatory techniques to lift head. Trunk remains mildly flexed. Assist required to slowly lower pt into chair safely.  Ambulation/Gait                Stairs            Wheelchair Mobility    Modified Rankin (Stroke Patients Only)       Balance Overall balance assessment: Needs assistance Sitting-balance support: No upper extremity supported;Feet supported Sitting balance-Leahy Scale: Fair     Standing balance support: Bilateral upper extremity supported Standing balance-Leahy Scale: Poor                               Pertinent Vitals/Pain Pain Assessment: No/denies pain (grimaces with movement, denies pain med from RN) Faces Pain Scale: Hurts little more Pain Location: abdomen Pain Descriptors / Indicators: Grimacing Pain Intervention(s): Limited activity within patient's tolerance;Monitored during session  BP 127/65 HR 82 SpO2 92-100% on room air    Home Living Family/patient expects to be discharged to:: Private residence Living Arrangements: Spouse/significant other;Children Available Help at Discharge: Family;Available PRN/intermittently (pt will be alone from 8-4:00 as family works) Type of Home: Apartment Home Access: Stairs to enter   Secretary/administrator of Steps: 3 Home Layout: Two level;1/2 bath on main level Home Equipment: None      Prior Function Level of Independence: Independent         Comments: pt walked every day, son reports pt as active  Hand Dominance   Dominant Hand: Right    Extremity/Trunk Assessment   Upper Extremity Assessment: Defer to OT evaluation           Lower Extremity Assessment: Overall WFL for tasks assessed         Communication   Communication: Prefers language other than [place] ([name])  Cognition Arousal/Alertness: Awake/alert Behavior  During Therapy: WFL for tasks assessed/performed Overall Cognitive Status: Difficult to assess                      General Comments General comments (skin integrity, edema, etc.): SpO2 92%-100% on room air    Exercises        Assessment/Plan    PT Assessment Patient needs continued PT services  PT Diagnosis Difficulty walking;Acute pain   PT Problem List Decreased strength;Decreased range of motion;Decreased activity tolerance;Decreased balance;Decreased mobility;Decreased knowledge of use of DME;Pain  PT Treatment Interventions DME instruction;Gait training;Stair training;Functional mobility training;Therapeutic activities;Therapeutic exercise;Balance training;Neuromuscular re-education;Patient/family education;Modalities   PT Goals (Current goals can be found in the Care Plan section) Acute Rehab PT Goals Patient Stated Goal: pt did not state PT Goal Formulation: With patient Time For Goal Achievement: 08/20/14 Potential to Achieve Goals: Good    Frequency Min 3X/week   Barriers to discharge Decreased caregiver support Will be home alone during the day    Co-evaluation               End of Session Equipment Utilized During Treatment: Gait belt Activity Tolerance: Patient limited by fatigue Patient left: in chair;with call bell/phone within reach Nurse Communication: Mobility status         Time: [id number] PT Time Calculation (min) (ACUTE ONLY): 17 min   Charges:   PT Evaluation $Initial PT Evaluation Tier I: 1 Procedure     PT G CodesBerton Mount:        Haylea Schlichting S 08/06/2014, 4:53 PM Sunday SpillersLogan Secor Vand[place], [place]PT [phone]

## 2014-08-07 DIAGNOSIS — R5381 Other malaise: Secondary | ICD-10-CM

## 2014-08-07 DIAGNOSIS — I714 Abdominal aortic aneurysm, without rupture: Principal | ICD-10-CM

## 2014-08-07 LAB — GLUCOSE, CAPILLARY
GLUCOSE-CAPILLARY: 119 mg/dL — AB (ref 70–99)
Glucose-Capillary: 131 mg/dL — ABNORMAL HIGH (ref 70–99)
Glucose-Capillary: 126 mg/dL — ABNORMAL HIGH (ref 70–99)
Glucose-Capillary: 122 mg/dL — ABNORMAL HIGH (ref 70–99)
Glucose-Capillary: 137 mg/dL — ABNORMAL HIGH (ref 70–99)

## 2014-08-07 MED ORDER — BISACODYL 10 MG RE SUPP
10.0000 mg | Freq: Once | RECTAL | Status: AC
  Administered 2014-08-07: 10 mg via RECTAL
  Filled 2014-08-07: qty 1

## 2014-08-07 NOTE — Consult Note (Signed)
Physical Medicine and Rehabilitation Consult Reason for Consult: Debilitation/abdominal aortic aneurysm repair Referring Physician: Dr. Hart Rochester   HPI: Frederick [name] Face is a 79 y.o. right handed non-English speaking male with history of known abdominal aortic aneurysm and right common iliac aneurysm. Patient with unsuccessful attempts at embolization of right internal iliac artery in the past needing resection. Patient independent prior to admission living with his wife. Admitted 08/05/2013 and underwent resection and grafting of abdominal aortic aneurysm and insertion of aorta to right external iliac and left common iliac grafting 08/05/2014 per Dr. Hart Rochester. Hospital course pain management. Acute blood loss anemia 9.7 and monitored. Subcutaneous Lovenox for DVT prophylaxis. Initially with NG feeding tube and currently nothing by mouth awaiting to obtain by mouth instructions.   Review of Systems  Unable to perform ROS: language   Past Medical History  Diagnosis Date  . Hyperlipidemia   . Hypertension   . Diabetes mellitus without complication     Type 2  . Constipation   . Arthritis    Past Surgical History  Procedure Laterality Date  . Abdominal adhesion surgery      for small bowel adhesions  . Thrombectomy femoral artery Left 06/25/2014    Procedure: Left Femoral Thrombectomy with Bilateral Leg Run Offs;  Surgeon: Fransisco Hertz, MD;  Location: [facility] OR;  Service: Vascular;  Laterality: Left;  . Embolization Right 06/25/2014    Procedure: EMBOLIZATION;  Surgeon: Fransisco Hertz, MD;  Location: [facility] CATH LAB;  Service: Cardiovascular;  Laterality: Right;  . Embolization N/A 07/06/2014    Procedure: EMBOLIZATION;  Surgeon: Fransisco Hertz, MD;  Location: [facility] CATH LAB;  Service: Cardiovascular;  Laterality: N/A;  . Eye surgery      cataracts  . Colonoscopy     Family History  Problem Relation Age of Onset  . Family history unknown: Yes   Social History:  reports that he quit smoking about 10  years ago. He does not have any smokeless tobacco history on file. He reports that he does not drink alcohol or use illicit drugs. Allergies: No Known Allergies Medications Prior to Admission  Medication Sig Dispense Refill  . fenofibrate (TRICOR) 145 MG tablet Take 145 mg by mouth daily.     Marland Kitchen lisinopril (PRINIVIL,ZESTRIL) 5 MG tablet Take 5 mg by mouth daily.     . metFORMIN (GLUCOPHAGE-XR) 500 MG 24 hr tablet Take 1 tablet (500 mg total) by mouth daily. Do not restart this medication until Sunday 07/05/14. 30 tablet 0  . omega-3 acid ethyl esters (LOVAZA) 1 G capsule Take 1 g by mouth 2 (two) times daily.     . pravastatin (PRAVACHOL) 20 MG tablet Take 20 mg by mouth daily.       Home: Home Living Family/patient expects to be discharged to:: Private residence Living Arrangements: Spouse/significant other, Children Available Help at Discharge: Family, Available PRN/intermittently (pt will be alone from 8-4:00 as family works) Type of Home: Apartment Home Access: Stairs to enter Secretary/administrator of Steps: 3 Home Layout: Two level, 1/2 bath on main level Alternate Level Stairs-Number of Steps: 1 flight Alternate Level Stairs-Rails: Right Home Equipment: None  Functional History: Prior Function Level of Independence: Independent Comments: pt walked every day, son reports pt as active Functional Status:  Mobility: Bed Mobility Overal bed mobility: Needs Assistance, +2 for physical assistance Bed Mobility: Rolling, Sidelying to Sit Rolling: Min assist, +2 for physical assistance Sidelying to sit: Min assist, +2 for physical assistance, HOB  elevated General bed mobility comments: Min assist +2 with education for log roll technique to exit bed. Grimicing with pain. Provided assist for truncal support, able to bring LEs off of bed without assist. Transfers Overall transfer level: Needs assistance Equipment used: 2 person hand held assist Transfers: Sit to/from Stand, Stand Pivot  Transfers Sit to Stand: +2 physical assistance, Min assist Stand pivot transfers: Min assist, +2 physical assistance General transfer comment: Min assist for boost to stand from lowest bed setting. VC for technique +2 handheld assist. Able to pivot with min assist for support and balance. No buckling noted. Not responding to cues and facilatory techniques to lift head. Trunk remains mildly flexed. Assist required to slowly lower pt into chair safely.      ADL: ADL Overall ADL's : Needs assistance/impaired Eating/Feeding: Independent, Sitting (ice chips) Grooming: Wash/dry hands, Wash/dry face, Sitting, Minimal assistance Upper Body Bathing: Minimal assitance, Sitting Lower Body Bathing: Minimal assistance, Sit to/from stand Upper Body Dressing : Minimal assistance, Sitting Lower Body Dressing: Minimal assistance, Sit to/from stand General ADL Comments: Abdominal pain limiting ability to access feet for bathing and dressing.  Cognition: Cognition Overall Cognitive Status: Difficult to assess Orientation Level: Oriented X4 Cognition Arousal/Alertness: Awake/alert Behavior During Therapy: WFL for tasks assessed/performed Overall Cognitive Status: Difficult to assess Difficult to assess due to: Non-English speaking  Blood pressure 136/73, pulse 85, temperature 98.1 F (36.7 C), temperature source Oral, resp. rate 23, height  (1.778 m), weight 68.4 kg (150 lb 12.7 oz), SpO2 94 %. Physical Exam  HENT:  Head: Normocephalic.  Eyes: EOM are normal.  Neck: Normal range of motion. Neck supple. No thyromegaly present.  Cardiovascular: Normal rate and regular rhythm.   Respiratory: Effort normal and breath sounds normal. No respiratory distress.  GI: Soft. Bowel sounds are normal.  Neurological: He is alert.  Very limited english speaking. Followed some demonstrated commands. UE's grossly 4/5. LE: 3+ HF, 4-/5 KE and 4/5 ankles. No gross sensory deficits.   Skin:  Surgical site  dressed    Results for orders placed or performed during the hospital encounter of 08/05/14 (from the past 24 hour(s))  Glucose, capillary     Status: Abnormal   Collection Time: 08/06/14 11:29 AM  Result Value Ref Range   Glucose-Capillary 143 (H) 70 - 99 mg/dL  Glucose, capillary     Status: Abnormal   Collection Time: 08/06/14  3:28 PM  Result Value Ref Range   Glucose-Capillary 140 (H) 70 - 99 mg/dL   Comment 1 Notify RN   Glucose, capillary     Status: Abnormal   Collection Time: 08/06/14  7:48 PM  Result Value Ref Range   Glucose-Capillary 122 (H) 70 - 99 mg/dL   Comment 1 Capillary Sample    Comment 2 Documented in Chart    Comment 3 Notify RN   Glucose, capillary     Status: Abnormal   Collection Time: 08/06/14 11:51 PM  Result Value Ref Range   Glucose-Capillary 132 (H) 70 - 99 mg/dL   Comment 1 Capillary Sample    Comment 2 Documented in Chart    Comment 3 Notify RN   Glucose, capillary     Status: Abnormal   Collection Time: 08/07/14  4:01 AM  Result Value Ref Range   Glucose-Capillary 131 (H) 70 - 99 mg/dL   Comment 1 Capillary Sample    Comment 2 Documented in Chart    Comment 3 Notify RN    Dg Chest Port 1  View  08/06/2014   CLINICAL DATA:  Surgery for abdominal aortic aneurysm repair, followup  EXAM: PORTABLE CHEST - 1 VIEW  COMPARISON:  Portable chest x-ray of 08/05/2014  FINDINGS: The lungs appear better aerated. Mild basilar volume loss remains. Cardiomegaly is stable. There may be minimal pulmonary vascular congestion present. Right IJ central venous line tip overlies the mid SVC.  IMPRESSION: Improved aeration. Mild basilar atelectasis. Possible mild pulmonary vascular congestion.   Electronically Signed   By: Dwyane Dee M.D.   On: 08/06/2014 08:04   Dg Chest Port 1 View  08/05/2014   CLINICAL DATA:  Status post abdominal aortic aneurysm repair and repair of left common iliac and right external iliac artery aneurysms.  EXAM: PORTABLE CHEST - 1 VIEW   COMPARISON:  PA and lateral chest 06/02/2011.  FINDINGS: Right IJ catheter is in place with the tip projecting in the mid to lower superior vena cava. There is no pneumothorax. NG tube courses into the stomach. Lung volumes are lower than on the comparison exam with crowding of the bronchovascular structures. Asymmetric airspace opacities are worse on the left. Heart size is enlarged.  IMPRESSION: Support apparatus projects in good position.  No pneumothorax.  Asymmetric airspace disease most compatible with interstitial edema and atelectasis.   Electronically Signed   By: Drusilla Kanner M.D.   On: 08/05/2014 13:02   Dg Abd Portable 1v  08/05/2014   CLINICAL DATA:  Perioperative for abdominal aortic aneurysm.  EXAM: PORTABLE ABDOMEN - 1 VIEW  COMPARISON:  CT abdomen and pelvis June 29, 2014  FINDINGS: The bowel gas pattern is normal. Nasogastric tube is identified with distal tip in the distal stomach. Midline skin staples are identified in the abdomen and pelvis. No other radio-opaque calculi or other significant radiographic abnormality are seen.  IMPRESSION: Midline skin staples are identified in the abdomen and pelvis. Normal bowel gas pattern. Nasogastric tube is identified with distal tip in the distal stomach.   Electronically Signed   By: Sherian Rein M.D.   On: 08/05/2014 13:39    Assessment/Plan: Diagnosis: Weakness, POD #2, after AAA rupture/ repair 1. Does the need for close, 24 hr/day medical supervision in concert with the patient's rehab needs make it unreasonable for this patient to be served in a less intensive setting? No 2. Co-Morbidities requiring supervision/potential complications: pain 3. Due to bladder management, bowel management, safety, skin/wound care, disease management, medication administration, pain management and patient education, does the patient require 24 hr/day rehab nursing? No 4. Does the patient require coordinated care of a physician, rehab nurse, PT (1-2  hrs/day, 5 days/week) and OT (1-2 hrs/day, 5 days/week) to address physical and functional deficits in the context of the above medical diagnosis(es)? No Addressing deficits in the following areas: balance, endurance, locomotion, strength, transferring, bowel/bladder control, bathing, dressing and feeding 5. Can the patient actively participate in an intensive therapy program of at least 3 hrs of therapy per day at least 5 days per week? Potentially 6. The potential for patient to make measurable gains while on inpatient rehab is fair 7. Anticipated functional outcomes upon discharge from inpatient rehab are n/a  with PT, n/a with OT, n/a with SLP. 8. Estimated rehab length of stay to reach the above functional goals is: n/a 9. Does the patient have adequate social supports and living environment to accommodate these discharge functional goals? Potentially 10. Anticipated D/C setting: Home 11. Anticipated post D/C treatments: HH therapy 12. Overall Rehab/Functional Prognosis: excellent  RECOMMENDATIONS: This  patient's condition is appropriate for continued rehabilitative care in the [facility] Patient has agreed to participate in recommended program. Potentially Note that insurance prior authorization may be required for reimbursement for recommended care.  Comment: If son/family can not provide initial assist then he may need short term SNF.  Ranelle OysterZachary T. Kimika Streater, MD, [facility]FAAPMR [facility] Physical Medicine & Rehabilitation 08/07/2014     08/07/2014

## 2014-08-07 NOTE — Progress Notes (Addendum)
  AAA Progress Note    08/07/2014 7:20 AM 2 Days Post-Op  Subjective:  Pt states he is burping but not passing gas.  Mouth feeling better.  Wants to know how long pain will last and when he will be able to drive.  Tm 100.8 now 99.9 HR 70's-80's NSR 120's-130's systolic 94% 1LO2NC  Filed Vitals:   08/07/14 0600  BP: 136/73  Pulse: 85  Temp:   Resp: 23    Physical Exam: Cardiac:  regular Lungs:  CTAB Abdomen:  Soft, NT/ND +BS; -BM Incisions:  C/d/i with staples in tact Extremities:  Bilateral feet are warm with 2+ palpable PT pulses  CBC    Component Value Date/Time   WBC 8.6 08/06/2014 0430   RBC 3.31* 08/06/2014 0430   HGB 9.7* 08/06/2014 0430   HCT 28.9* 08/06/2014 0430   PLT 130* 08/06/2014 0430   MCV 87.3 08/06/2014 0430   MCH 29.3 08/06/2014 0430   MCHC 33.6 08/06/2014 0430   RDW 14.0 08/06/2014 0430    BMET    Component Value Date/Time   NA 140 08/06/2014 0430   K 4.0 08/06/2014 0430   CL 103 08/06/2014 0430   CO2 23 08/06/2014 0430   GLUCOSE 174* 08/06/2014 0430   BUN 17 08/06/2014 0430   CREATININE 1.36* 08/06/2014 0430   CALCIUM 8.6 08/06/2014 0430   GFRNONAA 49* 08/06/2014 0430   GFRAA 57* 08/06/2014 0430    INR    Component Value Date/Time   INR 1.40 08/05/2014 1224     Intake/Output Summary (Last 24 hours) at 08/07/14 0720 Last data filed at 08/07/14 0700  Gross per 24 hour  Intake   2450 ml  Output   1805 ml  Net    645 ml     Assessment/Plan:  79 y.o. male is s/p  Resection and grafting of AAA & insertion of aorta to right EIA and left CIA with 14x8 mm hemashield graft 2 Days Post-Op  -pt doing well this am-he does have BS, but no flatus (he is burping)-tolerating ice chips-will d/w Dr. [name] about advancing to clear liquids. -will order dulcolax -glucose better controlled today -creatinine was also improved yesterday -mouth feeling better today -PT & CIR feels he would be an appropriate candidate-will order formal  consult - pt may progress to be able to go home over the next couple of days -transfer to 2 west -continue to increase mobilization. -d/c sleeve as he does have peripheral IV   Doreatha MassedSamantha Rhyne, PA-C [facility] [phone] 08/07/2014 7:20 AM  Patient's abdominal wound is healing nicely and his abdomen is relatively soft 3+ femoral and posterior tibial pulses palpable bilaterally. Patient is only 2 days postop. His ambulation will begin today. Patient was very independent and active prior to hospitalization. I feel he will be able to go home rather than to skilled nursing facility or CIR We will see how he progresses over the next few days  Plan transfer to 3 S. DC Foley catheter Continue to ambulate with assistance

## 2014-08-08 LAB — GLUCOSE, CAPILLARY
GLUCOSE-CAPILLARY: 129 mg/dL — AB (ref 70–99)
Glucose-Capillary: 117 mg/dL — ABNORMAL HIGH (ref 70–99)
Glucose-Capillary: 120 mg/dL — ABNORMAL HIGH (ref 70–99)
Glucose-Capillary: 115 mg/dL — ABNORMAL HIGH (ref 70–99)
Glucose-Capillary: 115 mg/dL — ABNORMAL HIGH (ref 70–99)

## 2014-08-08 NOTE — Progress Notes (Signed)
Physical Therapy Treatment Patient Details Name: Frederick [name] MRN: [id number] DOB: 1935/12/11 Today's Date: 08/08/2014    History of Present Illness s/p AAA repair    PT Comments    Pt progressing well with mobility.  Per chart review, CIR recommending HH therapy rather than inpatient rehab.  Attempted to call pt's son [name]) to see how much assistance family would be able to provide but unable to reach.  Pt currently requiring min assist for bed mobility & transfers, & min guard for ambulation.  If family can not provide adequate assistance this clinician recommends SNF prior to home.  .  D/c plans updated at this time.      Follow Up Recommendations  Home health PT;SNF     Equipment Recommendations  Rolling walker with 5" wheels;3in1 (PT)    Recommendations for Other Services       Precautions / Restrictions Precautions Precautions: Fall Restrictions Weight Bearing Restrictions: No    Mobility  Bed Mobility Overal bed mobility: Needs Assistance;+2 for physical assistance Bed Mobility: Rolling;Sidelying to Sit Rolling: Min assist Sidelying to sit: Min assist       General bed mobility comments: (A) for truncal support & to elevate shoulders/trunk to sitting upright.    Transfers Overall transfer level: Needs assistance Equipment used: Rolling walker (2 wheeled) Transfers: Sit to/from Stand Sit to Stand: Min assist         General transfer comment: cues for hand placement.  Min assist to achieve standing & stabilize RW.    Ambulation/Gait Ambulation/Gait assistance: Min guard Ambulation Distance (Feet): 300 Feet Assistive device: Rolling walker (2 wheeled) Gait Pattern/deviations: Step-through pattern;Decreased stride length Gait velocity: decreased   General Gait Details: verbal & tactile cues for upright posture & to look ahead.  Pt remains with mildly flexed posture.  Cues for safe RW management & body positioning with RW.     Stairs             Wheelchair Mobility    Modified Rankin (Stroke Patients Only)       Balance                                    Cognition Arousal/Alertness: Awake/alert Behavior During Therapy: WFL for tasks assessed/performed Overall Cognitive Status: Difficult to assess                      Exercises      General Comments        Pertinent Vitals/Pain Pain Assessment: No/denies pain    Home Living                      Prior Function            PT Goals (current goals can now be found in the care plan section) Acute Rehab PT Goals Patient Stated Goal: pt did not state PT Goal Formulation: With patient Time For Goal Achievement: 08/20/14 Potential to Achieve Goals: Good Progress towards PT goals: Progressing toward goals    Frequency  Min 3X/week    PT Plan Current plan remains appropriate    Co-evaluation             End of Session   Activity Tolerance: Patient tolerated treatment well Patient left: in chair;with call bell/phone within reach;Other (comment) (1L 02 reapplied )     Time: 9629-5284 PT Time Calculation (min) (ACUTE  ONLY): 18 min  Charges:  $Gait Training: 8-22 mins                    G Codes:      Lara MulchCooper, Lolamae Voisin Lynn 08/08/2014, 11:24 AM   Verdell FaceKelly Kaidan Spengler, PTA [phone] 08/08/2014

## 2014-08-08 NOTE — Progress Notes (Addendum)
  [facility] Progress Note  08/08/2014 7:58 AM 3 Days Post-Op  Subjective:  Passing flatus today. Denies any nausea or abdominal pain.   Tmax 99.1 BP sys 110s-130s 02 98% 1L  Filed Vitals:   08/08/14 0406  BP: 127/63  Pulse: 72  Temp: 98.5 F (36.9 C)  Resp: 18   Physical Exam: Abdomen: soft, normoactive bowel sounds, non tender, no distension. Incision clean and intact.  Extremities: 2+ posterior tibial pulses bilaterally.   CBC    Component Value Date/Time   WBC 8.6 08/06/2014 0430   RBC 3.31* 08/06/2014 0430   HGB 9.7* 08/06/2014 0430   HCT 28.9* 08/06/2014 0430   PLT 130* 08/06/2014 0430   MCV 87.3 08/06/2014 0430   MCH 29.3 08/06/2014 0430   MCHC 33.6 08/06/2014 0430   RDW 14.0 08/06/2014 0430    BMET    Component Value Date/Time   NA 140 08/06/2014 0430   K 4.0 08/06/2014 0430   CL 103 08/06/2014 0430   CO2 23 08/06/2014 0430   GLUCOSE 174* 08/06/2014 0430   BUN 17 08/06/2014 0430   CREATININE 1.36* 08/06/2014 0430   CALCIUM 8.6 08/06/2014 0430   GFRNONAA 49* 08/06/2014 0430   GFRAA 57* 08/06/2014 0430    INR    Component Value Date/Time   INR 1.40 08/05/2014 1224     Intake/Output Summary (Last 24 hours) at 08/08/14 0758 Last data filed at 08/08/14 0238  Gross per 24 hour  Intake   1000 ml  Output   1900 ml  Net   -900 ml   Assessment:  79 y.o. male is s/p: Resection and grafting of AAA & insertion of aorta to right EIA and left CIA with 14x8 mm hemashield graft 3 Days Post-Op  Plan: -Abdominal incision healing well with palpable PT pulses bilaterally.  -Passing flatus now with normoactive bowel sounds. Will try clear liquids today.  -Bowel regimen.  -Increase mobilization.  -DVT prophylaxis:  lovenox -[facility] consult [facility] He is progressing well. Will see how he does over next few days with mobilization and diet.   Maris BergerKimberly Trinh, PA-C [facility] Office:  [phone] Pager: [phone] 08/08/2014 7:58 AM   Agree with above. Slowly advance diet. Possibly home Monday  Waverly Ferrarihristopher Nusrat Encarnacion, MD, FACS Beeper [phone]

## 2014-08-09 LAB — BASIC METABOLIC PANEL
ANION GAP: 4 — AB (ref 5–15)
BUN: 12 mg/dL (ref 6–23)
CO2: 25 mmol/L (ref 19–32)
Calcium: 8.2 mg/dL — ABNORMAL LOW (ref 8.4–10.5)
Chloride: 108 mEq/L (ref 96–112)
Creatinine, Ser: 1.08 mg/dL (ref 0.50–1.35)
GFR calc non Af Amer: 65 mL/min — ABNORMAL LOW (ref >90)
GFR, EST AFRICAN AMERICAN: 75 mL/min — AB (ref >90)
GLUCOSE: 130 mg/dL — AB (ref 70–99)
Potassium: 3.8 mmol/L (ref 3.5–5.1)
SODIUM: 137 mmol/L (ref 135–145)

## 2014-08-09 LAB — GLUCOSE, CAPILLARY
Glucose-Capillary: 130 mg/dL — ABNORMAL HIGH (ref 70–99)
Glucose-Capillary: 108 mg/dL — ABNORMAL HIGH (ref 70–99)
Glucose-Capillary: 109 mg/dL — ABNORMAL HIGH (ref 70–99)
Glucose-Capillary: 136 mg/dL — ABNORMAL HIGH (ref 70–99)
Glucose-Capillary: 130 mg/dL — ABNORMAL HIGH (ref 70–99)
Glucose-Capillary: 104 mg/dL — ABNORMAL HIGH (ref 70–99)
Glucose-Capillary: 115 mg/dL — ABNORMAL HIGH (ref 70–99)

## 2014-08-09 LAB — CBC
HCT: 26.8 % — ABNORMAL LOW (ref 39.0–52.0)
Hemoglobin: 9 g/dL — ABNORMAL LOW (ref 13.0–17.0)
MCH: 28.8 pg (ref 26.0–34.0)
MCHC: 33.6 g/dL (ref 30.0–36.0)
MCV: 85.6 fL (ref 78.0–100.0)
Platelets: 152 10*3/uL (ref 150–400)
RBC: 3.13 MIL/uL — ABNORMAL LOW (ref 4.22–5.81)
RDW: 13.8 % (ref 11.5–15.5)
WBC: 4.2 10*3/uL (ref 4.0–10.5)

## 2014-08-09 LAB — TYPE AND SCREEN
ABO/RH(D): B POS
Antibody Screen: NEGATIVE
UNIT DIVISION: 0
Unit division: 0

## 2014-08-09 NOTE — Progress Notes (Addendum)
  [facility] Progress Note  08/09/2014 7:51 AM 4 Days Post-Op  Subjective:  Passing flatus. No BM yet. Tolerating clears. No nausea.   Filed Vitals:   08/09/14 0458  BP: 130/70  Pulse: 65  Temp: 98.9 F (37.2 C)  Resp: 18   Physical Exam: Incisions:  Abdominal staple line clean and intact.  Abdomen: soft, non tender, non distended, normoactive bowel sounds Extremities:  Palpable posterior tibial pulses bilaterally.   CBC    Component Value Date/Time   WBC 4.2 08/09/2014 0507   RBC 3.13* 08/09/2014 0507   HGB 9.0* 08/09/2014 0507   HCT 26.8* 08/09/2014 0507   PLT 152 08/09/2014 0507   MCV 85.6 08/09/2014 0507   MCH 28.8 08/09/2014 0507   MCHC 33.6 08/09/2014 0507   RDW 13.8 08/09/2014 0507    BMET    Component Value Date/Time   NA 137 08/09/2014 0507   K 3.8 08/09/2014 0507   CL 108 08/09/2014 0507   CO2 25 08/09/2014 0507   GLUCOSE 130* 08/09/2014 0507   BUN 12 08/09/2014 0507   CREATININE 1.08 08/09/2014 0507   CALCIUM 8.2* 08/09/2014 0507   GFRNONAA 65* 08/09/2014 0507   GFRAA 75* 08/09/2014 0507    INR    Component Value Date/Time   INR 1.40 08/05/2014 1224     Intake/Output Summary (Last 24 hours) at 08/09/14 0751 Last data filed at 08/09/14 0732  Gross per 24 hour  Intake      0 ml  Output   3200 ml  Net  -3200 ml     Assessment:  79 y.o. male is s/p: Resection and grafting of AAA & insertion of aorta to right EIA and left CIA with 14x8 mm hemashield graft 4 Days Post-Op  Plan: -Progressing well.  -Tolerating clear liquids well. Will advance to full liquids.   -ABLA: Hgb 9.0. Asymptomatic. Will monitor.  -Creatinine normal today at 1.08. Previously 1.36 on 08/06/14.  -Continue mobilization.  -DVT prophylaxis:  Lovenox -Likely discharge tomorrow.    Maris BergerKimberly Trinh, PA-C [facility] Office: [phone] Pager: [phone] 08/09/2014 7:51 AM  Agree with above.  Advance to fulls , then possibly  regular diet for dinner. Possibly home tomorrow.  Waverly Ferrarihristopher Dickson, MD, FACS Beeper [phone] 08/09/2014

## 2014-08-10 ENCOUNTER — Encounter (HOSPITAL_COMMUNITY): Payer: Self-pay | Admitting: Vascular Surgery

## 2014-08-10 ENCOUNTER — Telehealth: Payer: Self-pay | Admitting: Vascular Surgery

## 2014-08-10 LAB — GLUCOSE, CAPILLARY
GLUCOSE-CAPILLARY: 106 mg/dL — AB (ref 70–99)
Glucose-Capillary: 100 mg/dL — ABNORMAL HIGH (ref 70–99)
Glucose-Capillary: 103 mg/dL — ABNORMAL HIGH (ref 70–99)
Glucose-Capillary: 99 mg/dL (ref 70–99)

## 2014-08-10 MED ORDER — BISACODYL 10 MG RE SUPP
10.0000 mg | Freq: Once | RECTAL | Status: AC
  Administered 2014-08-10: 10 mg via RECTAL
  Filled 2014-08-10: qty 1

## 2014-08-10 MED ORDER — ASPIRIN EC 81 MG PO TBEC: 81.0000 mg | DELAYED_RELEASE_TABLET | Freq: Every day | ORAL | Status: AC

## 2014-08-10 NOTE — Progress Notes (Signed)
Rehab admissions - I attempted to meet with pt in follow up to rehab MD consult to share that rehab MD is recommending home with home health. In addition, rehab MD said that if family is not able to assist pt, then SNF may need to be explored.  MD was in speaking with pt and MD was on phone with family and I briefly heard discussion that pt will be going home today. MD was on phone extended time and it appears that plan is for home with home health.  I touched base with Cristi, case manager and Eileen StanfordJenna, social worker who were also aware of the plan for pt to be discharged to home today with son's help.  MD then was off the phone and updated me that pt/family are in agreement with this plan for home with home health support.   I will now sign off pt's case.  Juliann MuleJanine Patrice Matthew, PT Rehabilitation Admissions Coordinator [phone]

## 2014-08-10 NOTE — Progress Notes (Signed)
08/10/2014 1:25 PM Discharge AVS meds taken today and those due this evening reviewed.  Follow-up appointments and when to call md reviewed.  Instructed pt. And son on specific restrictions regarding AAA repair.  Discussed pt. Will have staples taken out at MD office when he follows up in office.  Instructed on incision care.  D/C IV and TELE.  Questions and concerns addressed.   D/C home per orders./c Kathryne HitchAllen, Jamyson Jirak C

## 2014-08-10 NOTE — Telephone Encounter (Signed)
-----   Message from Sharee PimpleMarilyn K McChesney, RN sent at 08/10/2014 10:03 AM EST ----- Regarding: FW: Schedule   ----- Message -----    From: Sharee PimpleMarilyn K McChesney, RN    Sent: 08/10/2014   9:57 AM      To: Vvs Charge Pool Subject: Schedule                                         ----- Message -----    From: Raymond GurneyKimberly A Trinh, PA-C    Sent: 08/10/2014   7:44 AM      To: Vvs Charge Pool  S/p open AAA repair 08/05/14  F/u with Dr. [name] in 2 weeks.  Thanks Selena BattenKim

## 2014-08-10 NOTE — Progress Notes (Signed)
Physical Therapy Treatment Patient Details Name: Frederick [name] [name] MRN: [id number] DOB: 03-Jul-1936 Today's Date: 08/10/2014    History of Present Illness s/p AAA repair    PT Comments    Patient has made [name] progress towards functional goals, mobilizing at a min guard level. Safely completed stair training this AM and ambulates up to 300 feet with mild balance deficits, improved by use of a rolling walker for support. He is adamant to return home today although states he will not have assistance throughout the day while his wife works. For this reason I recommend continued recovery with rehabilitation at SNF until pt is fully independent. If pt refuses, would recommend HHPT and possibly an aide to assist with daily activities at home due to patient precautions post-op. Patient will continue to benefit from skilled physical therapy services to further improve independence with functional mobility.   Follow Up Recommendations  SNF;Other (comment) (If pt refuses SNF - please maximize home health with HHPT)     Equipment Recommendations  Rolling walker with 5" wheels;3in1 (PT)    Recommendations for Other Services       Precautions / Restrictions Precautions Precautions: Fall Restrictions Weight Bearing Restrictions: No    Mobility  Bed Mobility Overal bed mobility: Needs Assistance Bed Mobility: Supine to Sit     Supine to sit: Min guard;HOB elevated     General bed mobility comments: Close guard for safety, with heavy reliance on bed rail and HOB elevated. VC for technique.  Transfers Overall transfer level: Needs assistance Equipment used: Rolling walker (2 wheeled) Transfers: Sit to/from Stand Sit to Stand: Min guard         General transfer comment: Close guard for safety. No loss of balance, minor sway noted but improves once holding RW.  Ambulation/Gait Ambulation/Gait assistance: Min guard Ambulation Distance (Feet): 300 Feet Assistive device: Rolling walker (2  wheeled) Gait Pattern/deviations: Step-through pattern;Decreased stride length;Trunk flexed;Staggering left Gait velocity: decreased   General Gait Details: Cues for upright posture intermittently. Close guard for safety with once instance of staggering, but able to self correct while holding a rolling walker for support.    Stairs Stairs: Yes Stairs assistance: Min guard Stair Management: One rail Right;Alternating pattern;Forwards Number of Stairs: 13 General stair comments: Slow and guarded, able to navigate flight of steps with single rail similar to home environment. Educated on safe technique and pt was able to perform with close guard for safety.  Wheelchair Mobility    Modified Rankin (Stroke Patients Only)       Balance                     Tandem Stance - Right Leg: 0.5 (with eyes open - 0 sec with eyes closed) Tandem Stance - Left Leg: 0.5 (with eyes open - 0 seconds with eyes closed) Rhomberg - Eyes Opened: 0.5 Rhomberg - Eyes Closed: 0.5        Cognition Arousal/Alertness: Awake/alert Behavior During Therapy: WFL for tasks assessed/performed Overall Cognitive Status: Difficult to assess                      Exercises General Exercises - Lower Extremity Ankle Circles/Pumps: AROM;Both;10 reps;Seated Quad Sets: Strengthening;Both;10 reps;Seated Long Arc Quad: Strengthening;Both;10 reps;Seated Heel Slides: Strengthening;Both;10 reps;Seated Straight Leg Raises: Strengthening;Both;10 reps;Seated Hip Flexion/Marching: Strengthening;Both;10 reps;Seated    General Comments        Pertinent Vitals/Pain Pain Assessment: No/denies pain Pain Intervention(s): Monitored during session;Repositioned  HR 97  Home Living                      Prior Function            PT Goals (current goals can now be found in the care plan section) Acute Rehab PT Goals PT Goal Formulation: With patient Time For Goal Achievement: 08/20/14 Potential  to Achieve Goals: [name] Progress towards PT goals: Progressing toward goals    Frequency  Min 3X/week    PT Plan Discharge plan needs to be updated    Co-evaluation             End of Session Equipment Utilized During Treatment: Gait belt Activity Tolerance: Patient tolerated treatment well Patient left: in chair;with family/visitor present     Time: 6295-2841 PT Time Calculation (min) (ACUTE ONLY): 23 min  Charges:  $Gait Training: 8-22 mins $Therapeutic Exercise: 8-22 mins                    G Codes:      Berton Mount 27-Aug-2014, 9:55 AM Charlsie Merles, PT [phone]

## 2014-08-10 NOTE — Progress Notes (Addendum)
  [facility] Progress Note  08/10/2014 7:38 AM 5 Days Post-Op  Subjective:  Denies nausea and abdominal pain. Passing flatus. No BM yet, but feels he has to go. Ready to go home.    Filed Vitals:   08/10/14 0424  BP: 129/67  Pulse: 70  Temp: 98.5 F (36.9 C)  Resp: 18    Physical Exam: Abdomen: Soft non-tender, no distension. Normoactive bowel sounds.  Midline abdominal incision clean dry and intact.  Extremities:  Palpable posterior tibial pulses bilaterally.   CBC    Component Value Date/Time   WBC 4.2 08/09/2014 0507   RBC 3.13* 08/09/2014 0507   HGB 9.0* 08/09/2014 0507   HCT 26.8* 08/09/2014 0507   PLT 152 08/09/2014 0507   MCV 85.6 08/09/2014 0507   MCH 28.8 08/09/2014 0507   MCHC 33.6 08/09/2014 0507   RDW 13.8 08/09/2014 0507    BMET    Component Value Date/Time   NA 137 08/09/2014 0507   K 3.8 08/09/2014 0507   CL 108 08/09/2014 0507   CO2 25 08/09/2014 0507   GLUCOSE 130* 08/09/2014 0507   BUN 12 08/09/2014 0507   CREATININE 1.08 08/09/2014 0507   CALCIUM 8.2* 08/09/2014 0507   GFRNONAA 65* 08/09/2014 0507   GFRAA 75* 08/09/2014 0507    INR    Component Value Date/Time   INR 1.40 08/05/2014 1224     Intake/Output Summary (Last 24 hours) at 08/10/14 0738 Last data filed at 08/10/14 0424  Gross per 24 hour  Intake 1216.66 ml  Output   2350 ml  Net -1133.34 ml     Assessment:  79 y.o. male is s/p: Resection and grafting of AAA & insertion of aorta to right EIA and left CIA with 14x8 mm hemashield graft 5 Days Post-Op  Plan: -Continuing to progress very well.  -Tolerated regular diet yesterday.  -Dulcolax suppository today. -PT recommending HH PT vs SNF. Patient would like to go home.  -[facility]    Maris BergerKimberly Trinh, PA-C [facility] Office: [phone] Pager: [phone] 08/10/2014 7:38 AM   Addendum  Spoke with PT who recommends SNF due to patient not  having family support from 9am-3pm daily. Spoke with patient via interpreter and discussed safety issues with being home.  Patient still expresses desire to go home instead. Activity and lifting restrictions were discussed at length with the patient as well as additional discharge instructions. Patient verbalized understanding. Will discharge home health PT. Follow up with Dr. [name] in 2 weeks.   Maris BergerKimberly Trinh, PA-C  Patient looks good with nicely healing abdominal incision and 3+ posterior tibial pulses palpable. Abdomen soft nontender. Ambulation is improving. Agree the patient should be able to be discharged to home as he desires Plan to see him back in 2 weeks for follow-up.

## 2014-08-10 NOTE — Care Management Note (Signed)
    Page 1 of 2   08/10/2014     3:43:10 PM CARE MANAGEMENT NOTE 08/10/2014  Patient:  Frederick [name],Frederick [name]   Account Number:  [id number]  Date Initiated:  08/06/2014  Documentation initiated by:  [facility]ARAH  Subjective/Objective Assessment:   Admitted post op AAA repair.     Action/Plan:   Anticipated DC Date:  08/12/2014   Anticipated DC Plan:  HOME W HOME HEALTH SERVICES      DC Planning Services  CM consult      [facility]  [facility]  DURABLE MEDICAL EQUIPMENT   Choice offered to / List presented to:  C-4 Adult Children   DME arranged  [facility]      DME agency  [facility]     HH arranged  HH-2 PT      HH agency  [facility]   Status of service:  Completed, signed off Medicare Important Message given?  YES (If response is "NO", the following Medicare IM given date fields will be blank) Date Medicare IM given:  08/10/2014 Medicare IM given by:  Donn PieriniWEBSTER,Katlen Seyer Date Additional Medicare IM given:   Additional Medicare IM given by:    Discharge Disposition:  HOME W HOME HEALTH SERVICES  Per UR Regulation:  Reviewed for med. necessity/level of care/duration of stay  If discussed at Long Length of Stay Meetings, dates discussed:    Comments:  Contact:  Kreger,Steve Son [phone]  08/10/14- 1130- Donn PieriniKristi Lovely Kerins RN, BSN, [phone] Pt for d/c home today, son here to stay with pt- spoke with son-Steve at bedside- offered choice for [facility] [place]- per Brett CanalesSteve they [facility]- explained to son that because 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- [facility] Are[facility]Eric [facility]- order faxed via epic to them for RW- they will contact Brett CanalesSteve regarding copay cost for RW. -  08-06-14 1:40pm Avie ArenasSarah Brown, RNBSN [phone] Patient per nurse does not speak  english.  Called son. Informed me that patient lives at home with his wife and son.  Independent.  Both wife and son work and are not home from about 8am till 4pm.  Son states no one else that can/could be with him.  Goal is to get him home, agreeable to home health if needed but understand only come in for visits.  CM will continue to follow patient progression.

## 2014-08-10 NOTE — Telephone Encounter (Signed)
LM for pt, LM for interp, dpm

## 2014-08-10 NOTE — Discharge Summary (Signed)
[facility] AAA Discharge Summary  Frederick [name] 04/03/1936 79 y.o. 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 male who has been seen for by Dr. [name] for his abdominal aortic and right common iliac aneurysms. He does not speak [place] and is accompanied by his son and a [name] interpreter. Patient had unsuccessful attempts at embolization of right internal iliac artery on 2 occasions by Dr. Imogene Burnhen. He will require open surgery for the abdominal aneurysm repair. Today's visit was to answer all questions and further discuss the operative procedure which required translation with the interpreter.  Hospital Course:  The patient was admitted to the hospital and taken to the operating room on 08/05/2014 and underwent: resection and grafting of abdominal aortic aneurysm and insertion of aorta to right external iliac and left common iliac with 14 x 8 mm hemashield graft.   The patient tolerated the procedure well and was transported to the PACU in stable condition. He was then transferred to the SICU.  On POD 1, he had minimal output from his NG tube. This was discontinued. He was started on ice chips. His abdomen was relatively soft and incision was clean and intact. He had 3+ femoral pulses and posterior tibial pulses bilaterally. He was kept in the ICU for increased mobilization.   He was transferred to the floor on POD 2. He began to ambulate. PT recommended inpatient rehab consult. He was burping but not passing flatus.  On POD 3, his bowel sounds were normoactive. He was starting to pass flatus. He was making [name] progress with ambulation. He was started on a clear liquid diet.   He continued to progress well on POD 4 and his diet was advanced as tolerated. He was able to tolerate a  regular diet for dinner.   On POD 5, he was ready to go home. His abdomen was soft and incision healing well. He had palpable posterior tibial pulses bilaterally. Physical therapy was recommending SNF due to lack of family support for 6 hours a day. The patient was very active and independent prior to hospitalization and wanted to go home with home health. Discharge instructions and activity limitations were discussed with an interpretor with his son present. He was discharged home on POD 5 with home health PT and OT. He will follow up in two weeks with Dr. [name].   CBC    Component Value Date/Time   WBC 4.2 08/09/2014 0507   RBC 3.13* 08/09/2014 0507   HGB 9.0* 08/09/2014 0507   HCT 26.8* 08/09/2014 0507   PLT 152 08/09/2014 0507   MCV 85.6 08/09/2014 0507   MCH 28.8 08/09/2014 0507   MCHC 33.6 08/09/2014 0507   RDW 13.8 08/09/2014 0507    BMET    Component Value Date/Time   NA 137 08/09/2014 0507   K 3.8 08/09/2014 0507   CL 108 08/09/2014 0507   CO2 25 08/09/2014 0507   GLUCOSE 130* 08/09/2014 0507   BUN 12 08/09/2014 0507   CREATININE 1.08 08/09/2014 0507   CALCIUM 8.2* 08/09/2014 0507   GFRNONAA 65* 08/09/2014 0507   GFRAA 75* 08/09/2014 0507     Discharge Instructions:   The patient is discharged to home with extensive instructions on wound care and progressive ambulation.  They are instructed not to drive or  perform any heavy lifting until returning to see the physician in his office.  Discharge Instructions    ABDOMINAL PROCEDURE/ANEURYSM REPAIR/AORTO-BIFEMORAL BYPASS:  Call MD for increased abdominal pain; cramping diarrhea; nausea/vomiting    Complete by:  As directed      Call MD for:  redness, tenderness, or signs of infection (pain, swelling, bleeding, redness, odor or green/yellow discharge around incision site)    Complete by:  As directed      Call MD for:  severe or increased pain, loss or decreased feeling  in affected limb(s)    Complete by:  As directed        Call MD for:  temperature >100.5    Complete by:  As directed      Discharge wound care:    Complete by:  As directed   Shower daily and wash over belly wound with soap and water.     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 AAA (abdominal aortic aneurysm) 08/05/2014  . Aneurysm, common iliac artery 07/06/2014  . Arterial occlusion, lower extremity 06/25/2014  . AAA (abdominal aortic aneurysm) without rupture 05/26/2014   Past Medical History  Diagnosis Date  . Hyperlipidemia   . Hypertension   . Diabetes mellitus without complication     Type 2  . Constipation   . Arthritis        Medication List    TAKE these medications        aspirin EC 81 MG tablet  Take 1 tablet (81 mg total) by mouth daily.     fenofibrate 145 MG tablet  Commonly known as:  TRICOR  Take 145 mg by mouth daily.     lisinopril 5 MG tablet  Commonly known as:  PRINIVIL,ZESTRIL  Take 5 mg by mouth daily.     metFORMIN 500 MG 24 hr tablet  Commonly known as:  GLUCOPHAGE-XR  Take 1 tablet (500 mg total) by mouth daily. Do not restart this medication until Sunday 07/05/14.     omega-3 acid ethyl esters 1 G capsule  Commonly known as:  LOVAZA  Take 1 g by mouth 2 (two) times daily.     pravastatin 20 MG tablet  Commonly known as:  PRAVACHOL  Take 20 mg by mouth daily.        Disposition: Home with home health PT and OT  Patient's condition: is [name]  Follow up: 1. Dr. Hart [name] in 2 weeks   Maris Berger,  PA-C [facility] [phone] 08/10/2014  2:19 PM   - For VQI Registry use ---   Post-op:  Time to Extubation: [x ] In OR,  < 12 hrs,  12-24 hrs,  >=24 hrs Vasopressors Req. Post-op: No ICU Stay: 2 days Transfusion: No   MI: No,  Troponin only,  EKG or Clinical New Arrhythmia:  No  Complications: CHF: No Resp failure: No,  Pneumonia,  Ventilator Chg in renal function: No,  Inc. Cr > 0.5,  Temp. Dialysis,  Permanent dialysis Leg ischemia: No, no Surgery needed,  Yes, Surgery needed,  Amputation Bowel ischemia: No,  Medical Rx,  Surgical Rx Wound complication: No,  Superficial separation/infection,  Return to OR Return to OR: No  Return to OR for bleeding: No Stroke: No,  Minor,  Major  Discharge medications: Statin use:  Yes If No:  For Medical reasons,  Non-compliant ASA use:  Yes  If No:  For Medical reasons,  Non-compliant Plavix use:  No If No:  For Medical reasons,  Non-compliant Beta blocker use:  No If No: [ x] For Medical reasons,  Non-compliant

## 2014-08-10 NOTE — Progress Notes (Signed)
Occupational Therapy Treatment Patient Details Name: Pauline GoodHui Muk Heidenreich MRN: [id number] DOB: [birth year] Today's Date: 08/10/2014    History of present illness s/p AAA repair   OT comments  Pt making excellent progress. Feel pt is safe to D/C home with intermittent S. Discussed home safety and recommendations with pt/son. Recommend follow up with HHOT to facilitate return to PLOF. Left PA message for need for HHOT.   Follow Up Recommendations  Home health OT;Supervision - Intermittent    Equipment Recommendations  Tub/shower seat (son states they will get shower chair if needed)    Recommendations for Other Services      Precautions / Restrictions Precautions Precautions: Fall Restrictions Weight Bearing Restrictions: No       Mobility  Transfers Overall transfer level: Modified independent Equipment used: Rolling walker (2 wheeled) Transfers: Sit to/from Stand          General transfer comment:Good use of safety techniques. vc for position of self in RW. Able to return demonstrate   Balance    No apparent LOB during functional mobility                       ADL       Grooming: Modified independent           Upper Body Dressing : Set up   Lower Body Dressing: Set up                 General ADL Comments: Overall set up for ADL. Pt with good balance during functional tasks using B hands. Educated pt/son on home safety and set up to reduce risk of falls and maximize functional level of independence. Discussed home safety and need for pt to have phone with him at all times. Discussed use o fbag on RW to reduce need to hold anything while using RW. pt/son verbalized understanding.                                      Cognition   Behavior During Therapy: WFL for tasks assessed/performed Overall Cognitive Status: Within Functional Limits for tasks assessed (son states cognition is "normal")                        Extremity/Trunk Assessment               Exercises General Exercises - Upper Extremity Shoulder Flexion: AROM;10 reps;Seated Shoulder ABduction: AROM;Both;10 reps Elbow Flexion: AROM;Both;10 reps Elbow Extension: AROM;Both;10 reps           General Comments  pt/son very appreciative    Pertinent Vitals/ Pain       Pain Assessment: No/denies pain                                                           Frequency Min 2X/week     Progress Toward Goals  OT Goals(current goals can now be found in the care plan section)  Progress towards OT goals: Progressing toward goals  Acute Rehab OT Goals Patient Stated Goal: to go home OT Goal Formulation: With patient/family Time For Goal Achievement: 08/20/14 Potential to Achieve Goals: Good ADL Goals Pt Will Perform Grooming: with  modified independence;standing Pt Will Perform Lower Body Bathing: with modified independence;sit to/from stand Pt Will Perform Lower Body Dressing: with modified independence;sit to/from stand Pt Will Transfer to Toilet: with modified independence;ambulating;regular height toilet Pt Will Perform Toileting - Clothing Manipulation and hygiene: with modified independence;sit to/from stand Pt Will Perform Tub/Shower Transfer: Tub transfer;ambulating;with supervision;rolling walker  Plan Discharge plan needs to be updated                     End of Session Equipment Utilized During Treatment: Gait belt;Rolling walker   Activity Tolerance Patient tolerated treatment well   Patient Left in chair;with call bell/phone within reach;with family/visitor present   Nurse Communication Mobility status        Time: 1610-9604 OT Time Calculation (min): 23 min  Charges: OT General Charges $OT Visit: 1 Procedure OT Treatments $Self Care/Home Management : 23-37 mins  Terre Zabriskie,HILLARY 08/10/2014, 11:18 AM   Luisa Dago, OTR/L  [phone] 08/10/2014

## 2014-08-12 ENCOUNTER — Emergency Department (HOSPITAL_COMMUNITY): Payer: Medicare HMO

## 2014-08-12 ENCOUNTER — Telehealth: Payer: Self-pay

## 2014-08-12 ENCOUNTER — Inpatient Hospital Stay (HOSPITAL_COMMUNITY)
Admission: EM | Admit: 2014-08-12 | Discharge: 2014-08-25 | DRG: 394 | Disposition: A | Discharge status: Home / Self Care | Payer: Medicare HMO | Attending: Vascular Surgery | Admitting: Vascular Surgery

## 2014-08-12 ENCOUNTER — Encounter (HOSPITAL_COMMUNITY): Payer: Self-pay | Admitting: Physical Medicine and Rehabilitation

## 2014-08-12 DIAGNOSIS — K2970 Gastritis, unspecified, without bleeding: Secondary | ICD-10-CM | POA: Diagnosis present

## 2014-08-12 DIAGNOSIS — K913 Postprocedural intestinal obstruction: Secondary | ICD-10-CM | POA: Diagnosis not present

## 2014-08-12 DIAGNOSIS — Z87891 Personal history of nicotine dependence: Secondary | ICD-10-CM

## 2014-08-12 DIAGNOSIS — R1110 Vomiting, unspecified: Secondary | ICD-10-CM

## 2014-08-12 DIAGNOSIS — K567 Ileus, unspecified: Secondary | ICD-10-CM | POA: Diagnosis present

## 2014-08-12 DIAGNOSIS — B370 Candidal stomatitis: Secondary | ICD-10-CM | POA: Diagnosis present

## 2014-08-12 DIAGNOSIS — Y838 Other surgical procedures as the cause of abnormal reaction of the patient, or of later complication, without mention of misadventure at the time of the procedure: Secondary | ICD-10-CM | POA: Diagnosis present

## 2014-08-12 DIAGNOSIS — E785 Hyperlipidemia, unspecified: Secondary | ICD-10-CM | POA: Diagnosis present

## 2014-08-12 DIAGNOSIS — Z0189 Encounter for other specified special examinations: Secondary | ICD-10-CM

## 2014-08-12 DIAGNOSIS — R109 Unspecified abdominal pain: Secondary | ICD-10-CM | POA: Diagnosis not present

## 2014-08-12 DIAGNOSIS — E119 Type 2 diabetes mellitus without complications: Secondary | ICD-10-CM | POA: Diagnosis present

## 2014-08-12 DIAGNOSIS — Z01818 Encounter for other preprocedural examination: Secondary | ICD-10-CM

## 2014-08-12 DIAGNOSIS — Z7982 Long term (current) use of aspirin: Secondary | ICD-10-CM

## 2014-08-12 DIAGNOSIS — R112 Nausea with vomiting, unspecified: Secondary | ICD-10-CM

## 2014-08-12 DIAGNOSIS — I714 Abdominal aortic aneurysm, without rupture, unspecified: Secondary | ICD-10-CM

## 2014-08-12 DIAGNOSIS — I10 Essential (primary) hypertension: Secondary | ICD-10-CM | POA: Diagnosis present

## 2014-08-12 DIAGNOSIS — K9189 Other postprocedural complications and disorders of digestive system: Secondary | ICD-10-CM | POA: Diagnosis present

## 2014-08-12 DIAGNOSIS — R1115 Cyclical vomiting syndrome unrelated to migraine: Secondary | ICD-10-CM

## 2014-08-12 LAB — COMPREHENSIVE METABOLIC PANEL
ALK PHOS: 34 U/L — AB (ref 39–117)
ALT: 19 U/L (ref 0–53)
AST: 21 U/L (ref 0–37)
Albumin: 3.4 g/dL — ABNORMAL LOW (ref 3.5–5.2)
Anion gap: 7 (ref 5–15)
BUN: 25 mg/dL — ABNORMAL HIGH (ref 6–23)
CO2: 29 mmol/L (ref 19–32)
Calcium: 9.5 mg/dL (ref 8.4–10.5)
Chloride: 101 mEq/L (ref 96–112)
Creatinine, Ser: 1.3 mg/dL (ref 0.50–1.35)
GFR, EST AFRICAN AMERICAN: 60 mL/min — AB (ref >90)
GFR, EST NON AFRICAN AMERICAN: 52 mL/min — AB (ref >90)
GLUCOSE: 143 mg/dL — AB (ref 70–99)
POTASSIUM: 4.2 mmol/L (ref 3.5–5.1)
Sodium: 137 mmol/L (ref 135–145)
Total Bilirubin: 1.1 mg/dL (ref 0.3–1.2)
Total Protein: 7.4 g/dL (ref 6.0–8.3)

## 2014-08-12 LAB — CBC WITH DIFFERENTIAL/PLATELET
BASOS ABS: 0 10*3/uL (ref 0.0–0.1)
Basophils Relative: 0 % (ref 0–1)
EOS ABS: 0 10*3/uL (ref 0.0–0.7)
EOS PCT: 1 % (ref 0–5)
HEMATOCRIT: 30.3 % — AB (ref 39.0–52.0)
HEMOGLOBIN: 10.2 g/dL — AB (ref 13.0–17.0)
Lymphocytes Relative: 15 % (ref 12–46)
Lymphs Abs: 1 10*3/uL (ref 0.7–4.0)
MCH: 28.8 pg (ref 26.0–34.0)
MCHC: 33.7 g/dL (ref 30.0–36.0)
MCV: 85.6 fL (ref 78.0–100.0)
Monocytes Absolute: 0.4 10*3/uL (ref 0.1–1.0)
Monocytes Relative: 6 % (ref 3–12)
Neutro Abs: 5.2 10*3/uL (ref 1.7–7.7)
Neutrophils Relative %: 78 % — ABNORMAL HIGH (ref 43–77)
PLATELETS: 337 10*3/uL (ref 150–400)
RBC: 3.54 MIL/uL — ABNORMAL LOW (ref 4.22–5.81)
RDW: 13.9 % (ref 11.5–15.5)
WBC: 6.6 10*3/uL (ref 4.0–10.5)

## 2014-08-12 LAB — LIPASE, BLOOD: Lipase: 93 U/L — ABNORMAL HIGH (ref 11–59)

## 2014-08-12 LAB — LACTIC ACID, PLASMA: LACTIC ACID, VENOUS: 1.1 mmol/L (ref 0.5–2.2)

## 2014-08-12 MED ORDER — ONDANSETRON HCL 4 MG/2ML IJ SOLN
4.0000 mg | Freq: Once | INTRAMUSCULAR | Status: AC
  Administered 2014-08-12: 4 mg via INTRAVENOUS
  Filled 2014-08-12: qty 2

## 2014-08-12 MED ORDER — IOHEXOL 300 MG/ML  SOLN: 25.0000 mL | Freq: Once | INTRAMUSCULAR | Status: AC | PRN

## 2014-08-12 MED ORDER — IOHEXOL 300 MG/ML  SOLN
25.0000 mL | Freq: Once | INTRAMUSCULAR | Status: AC | PRN
  Administered 2014-08-12: 25 mL via ORAL

## 2014-08-12 MED ORDER — IOHEXOL 300 MG/ML  SOLN
100.0000 mL | Freq: Once | INTRAMUSCULAR | Status: AC | PRN
  Administered 2014-08-12: 100 mL via INTRAVENOUS

## 2014-08-12 MED ORDER — SODIUM CHLORIDE 0.9 % IV BOLUS (SEPSIS)
1000.0000 mL | Freq: Once | INTRAVENOUS | Status: AC
  Administered 2014-08-12: 1000 mL via INTRAVENOUS

## 2014-08-12 NOTE — ED Notes (Signed)
Patient states he filled a urinal up with vomit twice today.  Family states that the urinals hold a liter of fluid.

## 2014-08-12 NOTE — ED Provider Notes (Signed)
CSN: [id number]     Arrival date & time 08/12/14  1844 History   First MD Initiated Contact with Patient 08/12/14 1905     Chief Complaint  Patient presents with  . Abdominal Pain  . Emesis     (Consider location/radiation/quality/duration/timing/severity/associated sxs/prior Treatment) Patient is a 79 y.o. male presenting with vomiting. The history is provided by the patient and a relative. No language interpreter was used.  Emesis Severity:  Severe Duration:  1 day Timing:  Constant Number of daily episodes:  4 Quality:  Bilious material Progression:  Unchanged Chronicity:  New Recent urination:  Normal Relieved by:  Nothing Worsened by:  Nothing tried Ineffective treatments:  None tried Associated symptoms: no abdominal pain, no chills, no cough, no diarrhea, no fever and no myalgias   Risk factors: prior abdominal surgery     Past Medical History  Diagnosis Date  . Hyperlipidemia   . Hypertension   . Diabetes mellitus without complication     Type 2  . Constipation   . Arthritis    Past Surgical History  Procedure Laterality Date  . Abdominal adhesion surgery      for small bowel adhesions  . Thrombectomy femoral artery Left 06/25/2014    Procedure: Left Femoral Thrombectomy with Bilateral Leg Run Offs;  Surgeon: Fransisco Hertz, MD;  Location: [facility] OR;  Service: Vascular;  Laterality: Left;  . Embolization Right 06/25/2014    Procedure: EMBOLIZATION;  Surgeon: Fransisco Hertz, MD;  Location: [facility] CATH LAB;  Service: Cardiovascular;  Laterality: Right;  . Embolization N/A 07/06/2014    Procedure: EMBOLIZATION;  Surgeon: Fransisco Hertz, MD;  Location: [facility] CATH LAB;  Service: Cardiovascular;  Laterality: N/A;  . Eye surgery      cataracts  . Colonoscopy    . Aortoiliac bypass N/A 08/05/2014    Procedure: RESECTION AND GRAFTING OF ABDOMINAL AORTIC ANEURYSM AND INSERTION OF AORTA TO RIGHT EXTERNAL ILIAC & LEFT COMMON ILIAC WITH 14X 8 MM X40 CM HEMASHIELD GRAFT;  Surgeon: Pryor Ochoa, MD;  Location: [facility] OR;  Service: Vascular;  Laterality: N/A;   Family History  Problem Relation Age of Onset  . Family history unknown: Yes   History  Substance Use Topics  . Smoking status: Former Smoker -- 28 years    Quit date: 05/26/2004  . Smokeless tobacco: Not on file  . Alcohol Use: No    Review of Systems  Constitutional: Negative for fever and chills.  Respiratory: Negative for cough and shortness of breath.   Cardiovascular: Negative for chest pain.  Gastrointestinal: Positive for vomiting and constipation. Negative for abdominal pain and diarrhea.  Genitourinary: Negative for dysuria, urgency, frequency and testicular pain.  Musculoskeletal: Negative for myalgias.  All other systems reviewed and are negative.     Allergies  Review of patient's allergies indicates no known allergies.  Home Medications   Prior to Admission medications   Medication Sig Start Date End Date Taking? Authorizing Provider  aspirin EC 81 MG tablet Take 1 tablet (81 mg total) by mouth daily. 08/10/14  Yes Raymond Gurney, PA-C  fenofibrate (TRICOR) 145 MG tablet Take 145 mg by mouth daily.  05/20/14  Yes Historical Provider, MD  lisinopril (PRINIVIL,ZESTRIL) 5 MG tablet Take 5 mg by mouth daily.  05/20/14  Yes Historical Provider, MD  metFORMIN (GLUCOPHAGE-XR) 500 MG 24 hr tablet Take 1 tablet (500 mg total) by mouth daily. Do not restart this medication until Sunday 07/05/14. 06/26/14  Yes Cala Bradford A  Trinh, PA-C  omega-3 acid ethyl esters (LOVAZA) 1 G capsule Take 1 g by mouth 2 (two) times daily.  05/20/14  Yes Historical Provider, MD  pravastatin (PRAVACHOL) 20 MG tablet Take 20 mg by mouth daily.  05/18/14  Yes Historical Provider, MD   BP 117/68 mmHg  Pulse 67  Temp(Src) 98.2 F (36.8 C) (Oral)  Resp 18  Ht  (1.778 m)  Wt 141 lb 9.6 oz (64.229 kg)  BMI 20.32 kg/m2  SpO2 96% Physical Exam  Constitutional: He is oriented to person, place, and time. He appears  well-developed and well-nourished. No distress.  HENT:  Head: Normocephalic and atraumatic.  Eyes: Pupils are equal, round, and reactive to light.  Neck: Normal range of motion.  Cardiovascular: Normal rate, regular rhythm and normal heart sounds.   Pulses:      Radial pulses are 2+ on the right side, and 2+ on the left side.       Femoral pulses are 2+ on the right side, and 2+ on the left side. Pulmonary/Chest: Effort normal and breath sounds normal. No respiratory distress. He has no decreased breath sounds. He has no wheezes. He has no rhonchi. He has no rales.  Abdominal: Soft. He exhibits distension. Bowel sounds are decreased. There is no tenderness. There is no rebound and no guarding.  MIdline abdominal incision.  Well approximated.  Moderate abdominal wall ecchymosis.  No tenderness to palpation.    Musculoskeletal: He exhibits no edema or tenderness.  Neurological: He is alert and oriented to person, place, and time. He exhibits normal muscle tone.  Skin: Skin is warm and dry.  Nursing note and vitals reviewed.   ED Course  Procedures (including critical care time) Labs Review Labs Reviewed  CBC WITH DIFFERENTIAL - Abnormal; Notable for the following:    RBC 3.54 (*)    Hemoglobin 10.2 (*)    HCT 30.3 (*)    Neutrophils Relative % 78 (*)    All other components within normal limits  COMPREHENSIVE METABOLIC PANEL - Abnormal; Notable for the following:    Glucose, Bld 143 (*)    BUN 25 (*)    Albumin 3.4 (*)    Alkaline Phosphatase 34 (*)    GFR calc non Af Amer 52 (*)    GFR calc Af Amer 60 (*)    All other components within normal limits  LIPASE, BLOOD - Abnormal; Notable for the following:    Lipase 93 (*)    All other components within normal limits  BASIC METABOLIC PANEL - Abnormal; Notable for the following:    Glucose, Bld 133 (*)    GFR calc non Af Amer 61 (*)    GFR calc Af Amer 70 (*)    All other components within normal limits  GLUCOSE, CAPILLARY -  Abnormal; Notable for the following:    Glucose-Capillary 110 (*)    All other components within normal limits  GLUCOSE, CAPILLARY - Abnormal; Notable for the following:    Glucose-Capillary 133 (*)    All other components within normal limits  GLUCOSE, CAPILLARY - Abnormal; Notable for the following:    Glucose-Capillary 126 (*)    All other components within normal limits  LACTIC ACID, PLASMA  URINALYSIS, ROUTINE W REFLEX MICROSCOPIC    Imaging Review Ct Abdomen Pelvis W Contrast  08/12/2014   CLINICAL DATA:  Postop abdominal aortic aneurysm repair. Surgery was on 08/05/2014. Constipation. Abdominal pain, unspecified abdominal location.  EXAM: CT ABDOMEN AND PELVIS WITH  CONTRAST  TECHNIQUE: Multidetector CT imaging of the abdomen and pelvis was performed using the standard protocol following bolus administration of intravenous contrast.  CONTRAST:  OMNIPAQUE IOHEXOL 300 MG/ML  SOLN  COMPARISON:  06/29/2014  FINDINGS: Chronic peripheral densities in the right lower lobe probably represent scarring or fibrosis. There is motion artifact at the lung bases.  Small foci of free air in the lower abdomen compatible with recent surgery. Surgical skin staples along the anterior abdomen. There is a small amount of perihepatic ascites. No acute abnormality involving the liver, portal venous system or gallbladder. Normal appearance of the spleen and pancreas. Normal appearance of both adrenal glands. Again noted are bilateral renal cysts.  Stomach is distended. There are dilated loops of proximal small bowel. Small amount of fluid in the pelvis. Small amount of gas within the urinary bladder may be iatrogenic.  Patient had surgical repair of the infrarenal abdominal aorta and common iliac artery aneurysms. The proximal abdominal aorta is patent. There is an infrarenal abdominal aortic graft with bilateral iliac limbs. The aortic graft and iliac limbs are patent. The right limb connects to the right  external iliac artery which is patent. The proximal right femoral arteries are patent. The proximal right internal iliac artery is occluded. The left iliac graft limb is patent and there is flow in the left external and internal iliac arteries. Surgical changes in the left common femoral artery.  The abdominal aortic aneurysm sac now measures 3.9 x 4.8 cm and the aorta previously measured up to 6.4 cm. Right common iliac artery aneurysm sac measures up to 2.8 cm previously measured 3.2 cm. The left common iliac artery sac measures 1.9 cm, previously measured 2.2 cm. Small amount of fluid and edema near the iliac artery anastomotic sites. No evidence for a postoperative abscess.  Small amount of fluid in the right lower quadrant. Grade 1 anterolisthesis at L4-L5 with bilateral pars defects at L4. No acute bone abnormality.  IMPRESSION: Surgical repair of the abdominal aortic aneurysm and iliac artery aneurysms.  Expected postsurgical changes in the abdomen with a small amount of free fluid and small amount of free air.  Distension of the stomach and mild dilatation of small bowel loops. Findings could represent a mild postoperative ileus.  Small amount of air within the urinary bladder may be iatrogenic.   Electronically Signed   By: Richarda Overlie M.D.   On: 08/12/2014 22:53     EKG Interpretation None      MDM   Final diagnoses:  Abdominal pain  Ileus  Patient is a 79 year old male with pertinent past medical history of an abdominal aortic aneurysm repaired on the 13th of this month who comes to the emergency department today with vomiting and inability to have bowel movement or flatus for the past 24 hours. Physical exam as above. Patient's history and exam are concerning for small bowel obstruction versus an ileus. Patient without any fevers, chills, or abdominal pain.  Doubt an infection. Initial workup included a CBC, CMP, lipase, lactic acid, and a CT abdomen and pelvis with contrast. CBC was  unremarkable. CMP was unremarkable. Lipase is 93. Lactic acid 1.1. CT of the abdomen demonstrated postsurgical changes there was no evidence of infection, no evidence of hemorrhage, and no obvious bowel obstruction. There was dilation of the stomach and small intestine consistent with a developing ileus. Patient was treated with Zofran and normal saline bolus with moderate improvement in his symptoms. He continued to report nausea.  As a result vascular surgery was consulted to evaluate the patient.  Final disposition will be per their recommendations. Labs and imaging reviewed by myself and considered in medical decision-making. Imaging was interpreted radiology. Care was discussed with attending Dr. Ethelda ChickJacubowitz.      Bethann BerkshireAaron Kamin Niblack, MD 08/13/14 1144  Doug SouSam Jacubowitz, MD 08/13/14 1539

## 2014-08-12 NOTE — ED Provider Notes (Signed)
History is obtained from medical interpreter using [place] language line. Patient speaks little [place]. Patient is status post abdominal aortic aneurysm repair one week ago. Presents today complaining of abdominal pain, he has vomited 3 times since today and his been unable to pass gas per rectum or have a bowel movement today. He feels much improved since treatment here. On exam patient is in no distress abdomen midline stapled surgical wound. Nondistended. Bowel sounds present. No tenderness  Doug SouSam Haelie Clapp, MD 08/12/14 2318

## 2014-08-12 NOTE — Telephone Encounter (Signed)
Rec'd voice message from pt's son re: c/o constipation.  Call returned to son this afternoon.  Reported the pt. had a BM the past 2 days, and had been passing gas until this morning.  Stated his father is complaining of some discomfort across the abdomen.  Reported he took a suppository this morning at 8:00 AM, and has not had any results.  Reported he vomited a large amt. Son asked nurse to call and speak with his father through an interpreter.  Able to call pt. And triage his symptoms via an interpreter.  The pt. c/o feeling "uncomfortable" in the region of his umbilicus and reported "swelling" in the abdomen. Denied any results from suppository this AM.  Denied passing any gas today at all.  Stated he vomited a "large amt." this morning, and hasn't vomited any more.  Reported he doesn't have pain; stated "just uncomfortable like I need to have a BM."  Discussed with Dr. Edilia Boickson; advised to send pt. to the ER to rule out a partial bowel obstruction.  Notified pt's son, Frederick [name]; advised of Dr. Adele Danickson's recommendations.  [facility]  Verb. Understanding

## 2014-08-12 NOTE — ED Notes (Signed)
Dr. Shela CommonsJ is at the bedside. Called CT scan for testing. Patient finished drinking contrast.

## 2014-08-12 NOTE — ED Notes (Signed)
Pt presents to department for evaluation of abdominal pain. Pt had aortic repair 08/05/14, pt reports nausea and vomiting today. Pt states "I feel like food is getting stuck in my throat." pt is alert and oriented x4.

## 2014-08-12 NOTE — ED Notes (Addendum)
Patient also vomited on the way to the room.  Vomit is green in color.

## 2014-08-13 ENCOUNTER — Inpatient Hospital Stay (HOSPITAL_COMMUNITY): Payer: Medicare HMO

## 2014-08-13 DIAGNOSIS — Y838 Other surgical procedures as the cause of abnormal reaction of the patient, or of later complication, without mention of misadventure at the time of the procedure: Secondary | ICD-10-CM | POA: Diagnosis present

## 2014-08-13 DIAGNOSIS — K2970 Gastritis, unspecified, without bleeding: Secondary | ICD-10-CM | POA: Diagnosis present

## 2014-08-13 DIAGNOSIS — I10 Essential (primary) hypertension: Secondary | ICD-10-CM | POA: Diagnosis present

## 2014-08-13 DIAGNOSIS — K913 Postprocedural intestinal obstruction: Secondary | ICD-10-CM | POA: Diagnosis present

## 2014-08-13 DIAGNOSIS — R109 Unspecified abdominal pain: Secondary | ICD-10-CM | POA: Diagnosis present

## 2014-08-13 DIAGNOSIS — K567 Ileus, unspecified: Secondary | ICD-10-CM | POA: Diagnosis present

## 2014-08-13 DIAGNOSIS — E119 Type 2 diabetes mellitus without complications: Secondary | ICD-10-CM | POA: Diagnosis present

## 2014-08-13 DIAGNOSIS — E785 Hyperlipidemia, unspecified: Secondary | ICD-10-CM | POA: Diagnosis present

## 2014-08-13 DIAGNOSIS — B370 Candidal stomatitis: Secondary | ICD-10-CM | POA: Diagnosis present

## 2014-08-13 DIAGNOSIS — Z7982 Long term (current) use of aspirin: Secondary | ICD-10-CM | POA: Diagnosis not present

## 2014-08-13 DIAGNOSIS — K9189 Other postprocedural complications and disorders of digestive system: Secondary | ICD-10-CM | POA: Diagnosis present

## 2014-08-13 DIAGNOSIS — Z87891 Personal history of nicotine dependence: Secondary | ICD-10-CM | POA: Diagnosis not present

## 2014-08-13 LAB — GLUCOSE, CAPILLARY
GLUCOSE-CAPILLARY: 110 mg/dL — AB (ref 70–99)
Glucose-Capillary: 133 mg/dL — ABNORMAL HIGH (ref 70–99)
Glucose-Capillary: 126 mg/dL — ABNORMAL HIGH (ref 70–99)
Glucose-Capillary: 134 mg/dL — ABNORMAL HIGH (ref 70–99)
Glucose-Capillary: 115 mg/dL — ABNORMAL HIGH (ref 70–99)

## 2014-08-13 LAB — BASIC METABOLIC PANEL
ANION GAP: 8 (ref 5–15)
BUN: 22 mg/dL (ref 6–23)
CALCIUM: 8.8 mg/dL (ref 8.4–10.5)
CO2: 23 mmol/L (ref 19–32)
CREATININE: 1.14 mg/dL (ref 0.50–1.35)
Chloride: 105 mEq/L (ref 96–112)
GFR calc Af Amer: 70 mL/min — ABNORMAL LOW (ref >90)
GFR calc non Af Amer: 61 mL/min — ABNORMAL LOW (ref >90)
GLUCOSE: 133 mg/dL — AB (ref 70–99)
Potassium: 3.9 mmol/L (ref 3.5–5.1)
SODIUM: 136 mmol/L (ref 135–145)

## 2014-08-13 MED ORDER — LISINOPRIL 5 MG PO TABS
5.0000 mg | ORAL_TABLET | Freq: Every day | ORAL | Status: DC
  Filled 2014-08-13: qty 1

## 2014-08-13 MED ORDER — KCL IN DEXTROSE-NACL 20-5-0.45 MEQ/L-%-% IV SOLN
INTRAVENOUS | Status: DC
  Administered 2014-08-13 – 2014-08-16 (×6): via INTRAVENOUS
  Administered 2014-08-16: 50 mL/h via INTRAVENOUS
  Administered 2014-08-17 – 2014-08-19 (×6): via INTRAVENOUS
  Filled 2014-08-13 (×26): qty 1000

## 2014-08-13 MED ORDER — METOPROLOL TARTRATE 1 MG/ML IV SOLN: 2.0000 mg | INTRAVENOUS | Status: DC | PRN

## 2014-08-13 MED ORDER — POTASSIUM CHLORIDE CRYS ER 20 MEQ PO TBCR: 20.0000 meq | EXTENDED_RELEASE_TABLET | Freq: Once | ORAL | Status: DC

## 2014-08-13 MED ORDER — ONDANSETRON HCL 4 MG/2ML IJ SOLN
4.0000 mg | Freq: Four times a day (QID) | INTRAMUSCULAR | Status: DC | PRN
  Administered 2014-08-13 – 2014-08-17 (×3): 4 mg via INTRAVENOUS
  Filled 2014-08-13 (×3): qty 2

## 2014-08-13 MED ORDER — PHENOL 1.4 % MT LIQD
1.0000 | OROMUCOSAL | Status: DC | PRN
  Filled 2014-08-13: qty 177

## 2014-08-13 MED ORDER — PANTOPRAZOLE SODIUM 40 MG IV SOLR
40.0000 mg | Freq: Every day | INTRAVENOUS | Status: DC
  Administered 2014-08-13 – 2014-08-22 (×10): 40 mg via INTRAVENOUS
  Filled 2014-08-13 (×12): qty 40

## 2014-08-13 MED ORDER — LABETALOL HCL 5 MG/ML IV SOLN
10.0000 mg | INTRAVENOUS | Status: DC | PRN
  Filled 2014-08-13: qty 4

## 2014-08-13 MED ORDER — PRAVASTATIN SODIUM 20 MG PO TABS
20.0000 mg | ORAL_TABLET | Freq: Every day | ORAL | Status: DC
  Filled 2014-08-13: qty 1

## 2014-08-13 MED ORDER — DOCUSATE SODIUM 100 MG PO CAPS
100.0000 mg | ORAL_CAPSULE | Freq: Two times a day (BID) | ORAL | Status: DC
  Filled 2014-08-13 (×2): qty 1

## 2014-08-13 MED ORDER — GUAIFENESIN-DM 100-10 MG/5ML PO SYRP: 15.0000 mL | ORAL_SOLUTION | ORAL | Status: DC | PRN

## 2014-08-13 MED ORDER — INSULIN ASPART 100 UNIT/ML ~~LOC~~ SOLN
0.0000 [IU] | SUBCUTANEOUS | Status: DC
Start: 2014-08-13 — End: 2014-08-25
  Administered 2014-08-13 – 2014-08-18 (×6): 1 [IU] via SUBCUTANEOUS
  Administered 2014-08-18: 2 [IU] via SUBCUTANEOUS
  Administered 2014-08-19 – 2014-08-24 (×15): 1 [IU] via SUBCUTANEOUS

## 2014-08-13 MED ORDER — HYDRALAZINE HCL 20 MG/ML IJ SOLN: 5.0000 mg | INTRAMUSCULAR | Status: DC | PRN

## 2014-08-13 MED ORDER — BISACODYL 10 MG RE SUPP: 10.0000 mg | Freq: Every day | RECTAL | Status: DC | PRN

## 2014-08-13 MED ORDER — POLYVINYL ALCOHOL 1.4 % OP SOLN
1.0000 [drp] | OPHTHALMIC | Status: DC | PRN
  Administered 2014-08-14 – 2014-08-15 (×3): 1 [drp] via OPHTHALMIC
  Filled 2014-08-13: qty 15

## 2014-08-13 MED ORDER — ENOXAPARIN SODIUM 40 MG/0.4ML ~~LOC~~ SOLN
40.0000 mg | SUBCUTANEOUS | Status: DC
  Administered 2014-08-13 – 2014-08-24 (×12): 40 mg via SUBCUTANEOUS
  Filled 2014-08-13 (×13): qty 0.4

## 2014-08-13 MED ORDER — OMEGA-3-ACID ETHYL ESTERS 1 G PO CAPS
1.0000 g | ORAL_CAPSULE | Freq: Two times a day (BID) | ORAL | Status: DC
  Filled 2014-08-13 (×2): qty 1

## 2014-08-13 MED ORDER — PANTOPRAZOLE SODIUM 40 MG PO TBEC
40.0000 mg | DELAYED_RELEASE_TABLET | Freq: Every day | ORAL | Status: DC
  Filled 2014-08-13: qty 1

## 2014-08-13 MED ORDER — ACETAMINOPHEN 650 MG RE SUPP: 325.0000 mg | RECTAL | Status: DC | PRN

## 2014-08-13 MED ORDER — ASPIRIN EC 81 MG PO TBEC
81.0000 mg | DELAYED_RELEASE_TABLET | Freq: Every day | ORAL | Status: DC
  Filled 2014-08-13: qty 1

## 2014-08-13 MED ORDER — MORPHINE SULFATE 2 MG/ML IJ SOLN: 2.0000 mg | INTRAMUSCULAR | Status: DC | PRN

## 2014-08-13 MED ORDER — ACETAMINOPHEN 325 MG PO TABS: 325.0000 mg | ORAL_TABLET | ORAL | Status: DC | PRN

## 2014-08-13 NOTE — Progress Notes (Addendum)
  Progress Note    08/13/2014 8:16 AM Hospital Day 1  Subjective:  States "no pass gas past 2 days-Monday and Tuesday yes, but past 2 days, no".   Denies any nausea today.  When asked how his belly feels, he grabbed my hand and gently pinched by hand.  Afebrile HR 60's-80's NSR 110's-140's systolic 96% RA  Filed Vitals:   08/13/14 0806  BP: 117/68  Pulse: 67  Temp: 98.2 F (36.8 C)  Resp: 18    Physical Exam: Cardiac:  regular Lungs:  Non labored Abdomen:  Soft, NT to palpation; occasional BS throughout; -flatus; -BM Extremities:  Palpable PT pulses bilaterally  CBC    Component Value Date/Time   WBC 6.6 08/12/2014 1913   RBC 3.54* 08/12/2014 1913   HGB 10.2* 08/12/2014 1913   HCT 30.3* 08/12/2014 1913   PLT 337 08/12/2014 1913   MCV 85.6 08/12/2014 1913   MCH 28.8 08/12/2014 1913   MCHC 33.7 08/12/2014 1913   RDW 13.9 08/12/2014 1913   LYMPHSABS 1.0 08/12/2014 1913   MONOABS 0.4 08/12/2014 1913   EOSABS 0.0 08/12/2014 1913   BASOSABS 0.0 08/12/2014 1913    BMET    Component Value Date/Time   NA 136 08/13/2014 0500   K 3.9 08/13/2014 0500   CL 105 08/13/2014 0500   CO2 23 08/13/2014 0500   GLUCOSE 133* 08/13/2014 0500   BUN 22 08/13/2014 0500   CREATININE 1.14 08/13/2014 0500   CALCIUM 8.8 08/13/2014 0500   GFRNONAA 61* 08/13/2014 0500   GFRAA 70* 08/13/2014 0500    INR    Component Value Date/Time   INR 1.40 08/05/2014 1224    No intake or output data in the 24 hours ending 08/13/14 0816   Assessment/Plan:  79 y.o. male is who is s/p AAA repair with aorto to right EIA and left [facility]  -pt's nausea is better this am-denies nausea this morning.  States he is not passing any gas past 2 days.  Says passing gas Monday and Tuesday, but none yesterday or today -has occasional bowel sounds throughout -continue NPO for now & IVF -will give dulcolax suppository   Doreatha MassedSamantha Rhyne, PA-C [facility] [phone] 08/13/2014 8:16 AM   About the same as last evening.  No further vomiting.  Bowel rest for now.  NPO, IV fluid Recheck lytes daily Ambulate in hallway  Fabienne Brunsharles Fields, MD [facility] [place] Office: [phone] Pager: [phone]

## 2014-08-13 NOTE — Progress Notes (Signed)
Utilization review completed. Kiowa Hollar, RN, BSN. 

## 2014-08-13 NOTE — H&P (Signed)
[facility] [place] HISTORY AND PHYSICAL   History of Present Illness:  Patient is a 79 y.o. year old male who presents for evaluation of nausea and vomiting post AAA repair. Pt had Aorto to right external and left common iliac by Dr [name] 1 week ago.  He began to have nausea and vomiting today with no bowel movement.  Denies abdominal pain.  Has been able to urinate.  No dysuria.  History obtained mainly from family pt speaks mainly [name].   Other medical problems include diabetes and elevated cholesterol which are stable.  Past Medical History  Diagnosis Date  . Hyperlipidemia   . Hypertension   . Diabetes mellitus without complication     Type 2  . Constipation   . Arthritis     Past Surgical History  Procedure Laterality Date  . Abdominal adhesion surgery      for small bowel adhesions  . Thrombectomy femoral artery Left 06/25/2014    Procedure: Left Femoral Thrombectomy with Bilateral Leg Run Offs;  Surgeon: Fransisco HertzBrian L Chen, MD;  [facility];  Service: Vascular;  Laterality: Left;  . Embolization Right 06/25/2014    Procedure: EMBOLIZATION;  Surgeon: Fransisco HertzBrian L Chen, MD;  [facility];  Service: Cardiovascular;  Laterality: Right;  . Embolization N/A 07/06/2014    Procedure: EMBOLIZATION;  Surgeon: Fransisco HertzBrian L Chen, MD;  [facility];  Service: Cardiovascular;  Laterality: N/A;  . Eye surgery      cataracts  . Colonoscopy    . Aortoiliac bypass N/A 08/05/2014    Procedure: RESECTION AND GRAFTING OF ABDOMINAL AORTIC ANEURYSM AND INSERTION OF AORTA TO RIGHT EXTERNAL ILIAC & LEFT COMMON ILIAC WITH 14X 8 MM X40 CM HEMASHIELD GRAFT;  Surgeon: Pryor OchoaJames D Lawson, MD;  Location: [facility] OR;  Service: Vascular;  Laterality: N/A;    Social History History  Substance Use Topics  . Smoking status: Former Smoker -- 28 years    Quit date: 05/26/2004  . Smokeless tobacco: Not on file  . Alcohol Use: No    Family History Family History  Problem Relation Age of  Onset  . Family history unknown: Yes    Allergies  No Known Allergies   Current Facility-Administered Medications  Medication Dose Route Frequency Provider Last Rate Last Dose  . aspirin EC tablet 81 mg  81 mg Oral Daily Sherren Kernsharles E Climmie Cronce, MD      . insulin aspart (novoLOG) injection 0-9 Units  0-9 Units Subcutaneous 6 times per day Sherren Kernsharles E Brandley Aldrete, MD      . lisinopril (PRINIVIL,ZESTRIL) tablet 5 mg  5 mg Oral Daily Sherren Kernsharles E Georgiann Neider, MD      . omega-3 acid ethyl esters (LOVAZA) capsule 1 g  1 g Oral BID Sherren Kernsharles E Bartosz Luginbill, MD      . pravastatin (PRAVACHOL) tablet 20 mg  20 mg Oral Daily Sherren Kernsharles E Aaren Atallah, MD       Current Outpatient Prescriptions  Medication Sig Dispense Refill  . aspirin EC 81 MG tablet Take 1 tablet (81 mg total) by mouth daily. 30 tablet 0  . fenofibrate (TRICOR) 145 MG tablet Take 145 mg by mouth daily.     Marland Kitchen. lisinopril (PRINIVIL,ZESTRIL) 5 MG tablet Take 5 mg by mouth daily.     . metFORMIN (GLUCOPHAGE-XR) 500 MG 24 hr tablet Take 1 tablet (500 mg total) by mouth daily. Do not restart this medication until Sunday 07/05/14. 30 tablet 0  . omega-3 acid ethyl esters (LOVAZA)  1 G capsule Take 1 g by mouth 2 (two) times daily.     . pravastatin (PRAVACHOL) 20 MG tablet Take 20 mg by mouth daily.       ROS:   General:  No weight loss, Fever, chills  HEENT: No recent headaches, no nasal bleeding, no visual changes, no sore throat  Neurologic: No dizziness, blackouts, seizures. No recent symptoms of stroke or mini- stroke. No recent episodes of slurred speech, or temporary blindness.  Cardiac: No recent episodes of chest pain/pressure, no shortness of breath at rest.  No shortness of breath with exertion.  Denies history of atrial fibrillation or irregular heartbeat  Vascular: No history of rest pain in feet.  No history of claudication.  No history of non-healing ulcer, No history of DVT   Pulmonary: No home oxygen, no productive cough, no hemoptysis,  No asthma or  wheezing  Musculoskeletal:   Arthritis,  Low back pain,   Joint pain  Hematologic:No history of hypercoagulable state.  No history of easy bleeding.  No history of anemia  Gastrointestinal: No hematochezia or melena,  No gastroesophageal reflux, no trouble swallowing  Urinary:  chronic Kidney disease,  on HD -  MWF or  TTHS,  Burning with urination,  Frequent urination,  Difficulty urinating;   Skin: No rashes  Psychological: No history of anxiety,  No history of depression   Physical Examination  Filed Vitals:   08/12/14 2115 08/12/14 2255 08/12/14 2315 08/12/14 2345  BP: 131/73 113/63 118/69 124/64  Pulse: 77 73 73 72  Temp:      TempSrc:      Resp: SpO2: 96% 96% 96% 96%    There is no weight on file to calculate BMI.  General:  Alert and oriented, no acute distress HEENT: Normal Neck: No JVD Pulmonary: Clear to auscultation bilaterally Cardiac: Regular Rate and Rhythm  Abdomen: Soft, non-tender, non-distended, no mass, healing midline incision with some surrounding ecchymosis Skin: No rash Extremity Pulses:  2+ radial, brachial, femoral, absent dorsalis pedis, posterior tibial pulses bilaterally Musculoskeletal: No deformity or edema  Neurologic: Upper and lower extremity motor 5/5 and symmetric  DATA:  CT abdomen stool in proximal colon not distal, some dilatation of small bowel and stomach BMET    Component Value Date/Time   NA 137 08/12/2014 1913   K 4.2 08/12/2014 1913   CL 101 08/12/2014 1913   CO2 29 08/12/2014 1913   GLUCOSE 143* 08/12/2014 1913   BUN 25* 08/12/2014 1913   CREATININE 1.30 08/12/2014 1913   CALCIUM 9.5 08/12/2014 1913   GFRNONAA 52* 08/12/2014 1913   GFRAA 60* 08/12/2014 1913    CBC    Component Value Date/Time   WBC 6.6 08/12/2014 1913   RBC 3.54* 08/12/2014 1913   HGB 10.2* 08/12/2014 1913   HCT 30.3* 08/12/2014 1913   PLT 337 08/12/2014 1913   MCV 85.6 08/12/2014 1913   MCH 28.8  08/12/2014 1913   MCHC 33.7 08/12/2014 1913   RDW 13.9 08/12/2014 1913   LYMPHSABS 1.0 08/12/2014 1913   MONOABS 0.4 08/12/2014 1913   EOSABS 0.0 08/12/2014 1913   BASOSABS 0.0 08/12/2014 1913      ASSESSMENT:  Post op ileus    PLAN:  Admit for IV hydration and bowel rest, NPO except meds for now  Fabienne Bruns, MD [facility] [place] Office: [phone] Pager: [phone]

## 2014-08-13 NOTE — Progress Notes (Signed)
08/13/2014 3:55 AM Will wait for day shift to finish the patient's admission questions because the patient is [name] and speaks very little [place] and he has no one here to help translate. Will continue to monitor patient. Harriet Massonavidson, Quinlan Vollmer E

## 2014-08-13 NOTE — Progress Notes (Signed)
Patient vomited 600 mls; notified PA; orders to change protonix from PO to IV; will continue to monitor.  Hermina BartersBOWMAN, Selam Pietsch M, RN

## 2014-08-14 LAB — GLUCOSE, CAPILLARY
GLUCOSE-CAPILLARY: 122 mg/dL — AB (ref 70–99)
GLUCOSE-CAPILLARY: 128 mg/dL — AB (ref 70–99)
GLUCOSE-CAPILLARY: 116 mg/dL — AB (ref 70–99)
Glucose-Capillary: 109 mg/dL — ABNORMAL HIGH (ref 70–99)
Glucose-Capillary: 110 mg/dL — ABNORMAL HIGH (ref 70–99)
Glucose-Capillary: 115 mg/dL — ABNORMAL HIGH (ref 70–99)
Glucose-Capillary: 131 mg/dL — ABNORMAL HIGH (ref 70–99)

## 2014-08-14 LAB — BASIC METABOLIC PANEL
ANION GAP: 7 (ref 5–15)
BUN: 15 mg/dL (ref 6–23)
CALCIUM: 8.8 mg/dL (ref 8.4–10.5)
CO2: 24 mmol/L (ref 19–32)
Chloride: 104 mEq/L (ref 96–112)
Creatinine, Ser: 1.26 mg/dL (ref 0.50–1.35)
GFR calc non Af Amer: 54 mL/min — ABNORMAL LOW (ref >90)
GFR, EST AFRICAN AMERICAN: 62 mL/min — AB (ref >90)
Glucose, Bld: 119 mg/dL — ABNORMAL HIGH (ref 70–99)
POTASSIUM: 3.9 mmol/L (ref 3.5–5.1)
Sodium: 135 mmol/L (ref 135–145)

## 2014-08-14 LAB — URINALYSIS, ROUTINE W REFLEX MICROSCOPIC
BILIRUBIN URINE: NEGATIVE
Glucose, UA: NEGATIVE mg/dL
Ketones, ur: NEGATIVE mg/dL
Leukocytes, UA: NEGATIVE
Nitrite: NEGATIVE
PROTEIN: NEGATIVE mg/dL
Specific Gravity, Urine: 1.021 (ref 1.005–1.030)
Urobilinogen, UA: 0.2 mg/dL (ref 0.0–1.0)
pH: 5.5 (ref 5.0–8.0)

## 2014-08-14 LAB — URINE MICROSCOPIC-ADD ON

## 2014-08-14 NOTE — Progress Notes (Signed)
[facility]  Patient Status: Active (receiving services up to time of hospitalization)  [facility] is providing the following services: PT  If patient discharges after hours, please call [phone].   Samul DadaMiranda Chris 08/14/2014, 4:09 PM

## 2014-08-14 NOTE — Progress Notes (Signed)
Patient informed RN he had flatus x2. Will continue to monitor. Frederick RobertsAisha Lorraine Terriquez RN

## 2014-08-14 NOTE — Progress Notes (Addendum)
  [facility] Progress Note  08/14/2014 7:53 AM  Subjective:  Having "little bit" of abdominal pain. Not passing flatus. Denies nausea today.    Filed Vitals:   08/14/14 0611  BP: 119/63  Pulse: 63  Temp: 97.7 F (36.5 C)  Resp: 18    Physical Exam: Abdomen: staple line clean and intact. Hypoactive bowel sounds. Soft non-tender, no distension.  Extremities: palpable posterior tibial pulses bilaterally  CBC    Component Value Date/Time   WBC 6.6 08/12/2014 1913   RBC 3.54* 08/12/2014 1913   HGB 10.2* 08/12/2014 1913   HCT 30.3* 08/12/2014 1913   PLT 337 08/12/2014 1913   MCV 85.6 08/12/2014 1913   MCH 28.8 08/12/2014 1913   MCHC 33.7 08/12/2014 1913   RDW 13.9 08/12/2014 1913   LYMPHSABS 1.0 08/12/2014 1913   MONOABS 0.4 08/12/2014 1913   EOSABS 0.0 08/12/2014 1913   BASOSABS 0.0 08/12/2014 1913    BMET    Component Value Date/Time   NA 135 08/14/2014 0534   K 3.9 08/14/2014 0534   CL 104 08/14/2014 0534   CO2 24 08/14/2014 0534   GLUCOSE 119* 08/14/2014 0534   BUN 15 08/14/2014 0534   CREATININE 1.26 08/14/2014 0534   CALCIUM 8.8 08/14/2014 0534   GFRNONAA 54* 08/14/2014 0534   GFRAA 62* 08/14/2014 0534    INR    Component Value Date/Time   INR 1.40 08/05/2014 1224     Intake/Output Summary (Last 24 hours) at 08/14/14 0753 Last data filed at 08/14/14 0734  Gross per 24 hour  Intake      0 ml  Output   3225 ml  Net  -3225 ml     Assessment:  79 y.o. male is s/p: s/p AAA repair with aorto to right EIA and left CIA 08/05/13; readmitted for post-op ileus hospital day 2   Plan: -No flatus yet.  -NG tube with high output. Will keep NG tube today and keep NPO except for meds. Patient had episode of vomiting yesterday and protonix changed to IV form. No vomiting today.  -Continue IV fluids. Labs are ok.  -Continue to mobilize.    Maris BergerKimberly Trinh, PA-C [facility] Office: [phone] Pager:  [phone] 08/14/2014 7:53 AM     NG about 600-700 over last 24 hours bilious Abdomen soft subjectively feels better No flatus  BMET    Component Value Date/Time   NA 135 08/14/2014 0534   K 3.9 08/14/2014 0534   CL 104 08/14/2014 0534   CO2 24 08/14/2014 0534   GLUCOSE 119* 08/14/2014 0534   BUN 15 08/14/2014 0534   CREATININE 1.26 08/14/2014 0534   CALCIUM 8.8 08/14/2014 0534   GFRNONAA 54* 08/14/2014 0534   GFRAA 62* 08/14/2014 0534      Continue NG until return of bowel function Continue IV fluids  Monitor electrolytes  Fabienne Brunsharles Kewanda Poland, MD [facility] [place] Office: [phone] Pager: [phone]

## 2014-08-15 LAB — BASIC METABOLIC PANEL
ANION GAP: 11 (ref 5–15)
BUN: 12 mg/dL (ref 6–23)
CALCIUM: 9.1 mg/dL (ref 8.4–10.5)
CO2: 20 mmol/L (ref 19–32)
Chloride: 104 mmol/L (ref 96–112)
Creatinine, Ser: 1.05 mg/dL (ref 0.50–1.35)
GFR, EST AFRICAN AMERICAN: 78 mL/min — AB (ref >90)
GFR, EST NON AFRICAN AMERICAN: 67 mL/min — AB (ref >90)
GLUCOSE: 133 mg/dL — AB (ref 70–99)
POTASSIUM: 3.9 mmol/L (ref 3.5–5.1)
SODIUM: 135 mmol/L (ref 135–145)

## 2014-08-15 LAB — GLUCOSE, CAPILLARY
GLUCOSE-CAPILLARY: 130 mg/dL — AB (ref 70–99)
Glucose-Capillary: 120 mg/dL — ABNORMAL HIGH (ref 70–99)
Glucose-Capillary: 105 mg/dL — ABNORMAL HIGH (ref 70–99)
Glucose-Capillary: 122 mg/dL — ABNORMAL HIGH (ref 70–99)
Glucose-Capillary: 119 mg/dL — ABNORMAL HIGH (ref 70–99)
Glucose-Capillary: 135 mg/dL — ABNORMAL HIGH (ref 70–99)

## 2014-08-15 NOTE — Progress Notes (Signed)
   Daily Progress Note  Assessment/Planning: POD #10 s/p Aortobi-iliac BPG   Resumption of BS though still elevated volumes last shift from NGT  Repeat abd xray to check if post-pyloric NGT  NGT clamp trial  Awaiting resolution of ileus  Subjective   Wants NGT out, flatus x 2  Objective Filed Vitals:   08/14/14 0611 08/14/14 1302 08/14/14 2012 08/15/14 0440  BP: 119/63 129/72 129/70 126/69  Pulse: 63 67 67 64  Temp: 97.7 F (36.5 C) 97.6 F (36.4 C) 98.8 F (37.1 C) 98.6 F (37 C)  TempSrc: Oral Oral Oral Oral  Resp: 18 16 18 18   Height:      Weight:      SpO2: 96% 97% 97% 96%    Intake/Output Summary (Last 24 hours) at 08/15/14 0927 Last data filed at 08/15/14 0511  Gross per 24 hour  Intake      0 ml  Output   1650 ml  Net  -1650 ml    PULM  CTAB CV  RRR GI  soft, NTND, +BS, inc c/d/i, +echymosis  Laboratory CBC    Component Value Date/Time   WBC 6.6 08/12/2014 1913   HGB 10.2* 08/12/2014 1913   HCT 30.3* 08/12/2014 1913   PLT 337 08/12/2014 1913    BMET    Component Value Date/Time   NA 135 08/15/2014 0354   K 3.9 08/15/2014 0354   CL 104 08/15/2014 0354   CO2 20 08/15/2014 0354   GLUCOSE 133* 08/15/2014 0354   BUN 12 08/15/2014 0354   CREATININE 1.05 08/15/2014 0354   CALCIUM 9.1 08/15/2014 0354   GFRNONAA 67* 08/15/2014 0354   GFRAA 78* 08/15/2014 0354    Leonides SakeBrian Chen, MD [facility] [place] Office: [phone] Pager: [phone]  08/15/2014, 9:27 AM

## 2014-08-15 NOTE — Progress Notes (Signed)
Patient's NG tube clamped at 1045.  Canister containing 700 mL disposed.  New canister started.  Wall suction resumed ate 1345.  400+ mL accumulation in less than 4 hours.  Will continue wall suction, document total at shift change and start new canister.

## 2014-08-16 LAB — BASIC METABOLIC PANEL
Anion gap: 8 (ref 5–15)
BUN: 11 mg/dL (ref 6–23)
CALCIUM: 9.1 mg/dL (ref 8.4–10.5)
CO2: 23 mmol/L (ref 19–32)
Chloride: 103 mmol/L (ref 96–112)
Creatinine, Ser: 1.01 mg/dL (ref 0.50–1.35)
GFR, EST AFRICAN AMERICAN: 81 mL/min — AB (ref >90)
GFR, EST NON AFRICAN AMERICAN: 70 mL/min — AB (ref >90)
Glucose, Bld: 132 mg/dL — ABNORMAL HIGH (ref 70–99)
POTASSIUM: 3.7 mmol/L (ref 3.5–5.1)
Sodium: 134 mmol/L — ABNORMAL LOW (ref 135–145)

## 2014-08-16 LAB — GLUCOSE, CAPILLARY
GLUCOSE-CAPILLARY: 126 mg/dL — AB (ref 70–99)
GLUCOSE-CAPILLARY: 118 mg/dL — AB (ref 70–99)
GLUCOSE-CAPILLARY: 129 mg/dL — AB (ref 70–99)
Glucose-Capillary: 106 mg/dL — ABNORMAL HIGH (ref 70–99)
Glucose-Capillary: 113 mg/dL — ABNORMAL HIGH (ref 70–99)

## 2014-08-16 MED ORDER — PROMETHAZINE HCL 25 MG/ML IJ SOLN: 12.5000 mg | Freq: Four times a day (QID) | INTRAMUSCULAR | Status: DC | PRN

## 2014-08-16 NOTE — Progress Notes (Addendum)
  Progress Note    08/16/2014 7:59 AM Hospital Day 4  Subjective:  Denies nausea; passed gas x 2 this morning; wants tube out  Afebrile VSS 98% RA  Filed Vitals:   08/16/14 0547  BP: 123/68  Pulse: 72  Temp: 98.7 F (37.1 C)  Resp: 16    Physical Exam: Cardiac:  regular Lungs:  Non labored Abdomen:  Soft NT/ND; +BS; +flatus Extremities:  2+ palpable PT pulses bilaterally  CBC    Component Value Date/Time   WBC 6.6 08/12/2014 1913   RBC 3.54* 08/12/2014 1913   HGB 10.2* 08/12/2014 1913   HCT 30.3* 08/12/2014 1913   PLT 337 08/12/2014 1913   MCV 85.6 08/12/2014 1913   MCH 28.8 08/12/2014 1913   MCHC 33.7 08/12/2014 1913   RDW 13.9 08/12/2014 1913   LYMPHSABS 1.0 08/12/2014 1913   MONOABS 0.4 08/12/2014 1913   EOSABS 0.0 08/12/2014 1913   BASOSABS 0.0 08/12/2014 1913    BMET    Component Value Date/Time   NA 134* 08/16/2014 0318   K 3.7 08/16/2014 0318   CL 103 08/16/2014 0318   CO2 23 08/16/2014 0318   GLUCOSE 132* 08/16/2014 0318   BUN 11 08/16/2014 0318   CREATININE 1.01 08/16/2014 0318   CALCIUM 9.1 08/16/2014 0318   GFRNONAA 70* 08/16/2014 0318   GFRAA 81* 08/16/2014 0318    INR    Component Value Date/Time   INR 1.40 08/05/2014 1224     Intake/Output Summary (Last 24 hours) at 08/16/14 0759 Last data filed at 08/16/14 0650  Gross per 24 hour  Intake      0 ml  Output   2075 ml  Net  -2075 ml     Assessment/Plan:  79 y.o. male is  AAA repair with aorto to right EIA and left CIA 08/05/13; readmitted for post-op ileus hospital day 2 Hospital Day 4  -NGT clamped yesterday for 4 hours-400+cc out after wall suction resumed.  There has been 300cc out last shift -will clamp tube this am for 4 hours and see output after clamp is off (discussed with RN) -hopefully, NGT out today and start clears-output is still marginal -increase mobilization as well   Doreatha MassedSamantha Rhyne, PA-C [facility] [phone] 08/16/2014 7:59  AM  Addendum  I have independently interviewed and examined the patient, and I agree with the physician assistant's findings.  I removed the NGT as I suspect it is post-pyloric based on prior Abd Xray.  Will see if pt tolerates clears.  Cut MIVF in half.  Leonides SakeBrian Dekari Bures, MD [facility] [place] Office: [phone] Pager: [phone]  08/16/2014, 9:54 AM

## 2014-08-16 NOTE — Progress Notes (Signed)
08/16/2014 10:51 PM The patient vomited a moderate amount of green colored emesis tonight. I gave him 4 mg of Zofran which made him feel better. The patient denied any feelings of pain or pressure in his abdominal area.  He was taken of his NG tube earlier today and put on a liquid diet.  Will continue to monitor the patient. Harriet Massonavidson, Chipper Koudelka E

## 2014-08-17 LAB — BASIC METABOLIC PANEL
Anion gap: 7 (ref 5–15)
BUN: 17 mg/dL (ref 6–23)
CALCIUM: 9.1 mg/dL (ref 8.4–10.5)
CO2: 23 mmol/L (ref 19–32)
CREATININE: 1.19 mg/dL (ref 0.50–1.35)
Chloride: 105 mmol/L (ref 96–112)
GFR calc Af Amer: 67 mL/min — ABNORMAL LOW (ref >90)
GFR calc non Af Amer: 58 mL/min — ABNORMAL LOW (ref >90)
GLUCOSE: 120 mg/dL — AB (ref 70–99)
Potassium: 4.3 mmol/L (ref 3.5–5.1)
SODIUM: 135 mmol/L (ref 135–145)

## 2014-08-17 LAB — GLUCOSE, CAPILLARY
GLUCOSE-CAPILLARY: 109 mg/dL — AB (ref 70–99)
GLUCOSE-CAPILLARY: 114 mg/dL — AB (ref 70–99)
Glucose-Capillary: 109 mg/dL — ABNORMAL HIGH (ref 70–99)
Glucose-Capillary: 105 mg/dL — ABNORMAL HIGH (ref 70–99)
Glucose-Capillary: 107 mg/dL — ABNORMAL HIGH (ref 70–99)

## 2014-08-17 NOTE — Progress Notes (Signed)
Pt ambulated 350 ft with RW.  Complained of cramping pain in back of right leg.  Relieved with rubbing and rest.  Pt assisted to bed after walk with call bell in reach.  Will con't plan of care.

## 2014-08-17 NOTE — Progress Notes (Addendum)
   [facility] [place]  Subjective  - He feels a little better.   Objective 100/54 80 98.2 F (36.8 C) (Oral) 16 100%  Intake/Output Summary (Last 24 hours) at 08/17/14 [id number] Last data filed at 08/17/14 0827  Gross per 24 hour  Intake    220 ml  Output    950 ml  Net   -730 ml    Abdomin soft, NTTP Palpable PT pulses bilaterally Heart RRR  Assessment/Planning:  AAA repair with aorto to right EIA and left CIA 08/05/13; readmitted for post-op ileus hospital  Advance to full liquids as tolerates Encourage mobility  Clinton GallantCOLLINS, EMMA [facility] 08/17/2014 8:32 AM --  Laboratory Lab Results: No results for input(s): WBC, HGB, HCT, PLT in the last 72 hours. BMET  Recent Labs  08/16/14 0318 08/17/14 0525  NA 134* 135  K 3.7 4.3  CL 103 105  CO2 23 23  GLUCOSE 132* 120*  BUN 11 17  CREATININE 1.01 1.19  CALCIUM 9.1 9.1    COAG Lab Results  Component Value Date   INR 1.40 08/05/2014   INR 1.12 08/05/2014   INR 1.12 06/25/2014   No results found for: PTT  Patient vomited 1 last p.m. Is not nauseated this morning Abdomen is soft and nontender with nicely healing midline incision 3+ femoral and posterior tibial pulses palpable Creatinine 1.0 with potassium 3.7  Will try advancing patient to full liquid diet to see if this is more palatable and encourage more ambulation in the halls Hopefully will tolerate this diet and we can advance to soft diet in a.m.

## 2014-08-17 NOTE — Progress Notes (Signed)
Utilization review completed.  

## 2014-08-18 ENCOUNTER — Inpatient Hospital Stay (HOSPITAL_COMMUNITY): Payer: Medicare HMO

## 2014-08-18 ENCOUNTER — Encounter (HOSPITAL_COMMUNITY): Payer: Self-pay | Admitting: General Surgery

## 2014-08-18 LAB — CBC WITH DIFFERENTIAL/PLATELET
BASOS PCT: 0 % (ref 0–1)
Basophils Absolute: 0 10*3/uL (ref 0.0–0.1)
Eosinophils Absolute: 0 10*3/uL (ref 0.0–0.7)
Eosinophils Relative: 0 % (ref 0–5)
HCT: 31.7 % — ABNORMAL LOW (ref 39.0–52.0)
HEMOGLOBIN: 10.7 g/dL — AB (ref 13.0–17.0)
Lymphocytes Relative: 9 % — ABNORMAL LOW (ref 12–46)
Lymphs Abs: 0.9 10*3/uL (ref 0.7–4.0)
MCH: 29.2 pg (ref 26.0–34.0)
MCHC: 33.8 g/dL (ref 30.0–36.0)
MCV: 86.6 fL (ref 78.0–100.0)
Monocytes Absolute: 0.7 10*3/uL (ref 0.1–1.0)
Monocytes Relative: 7 % (ref 3–12)
NEUTROS ABS: 8.4 10*3/uL — AB (ref 1.7–7.7)
Neutrophils Relative %: 84 % — ABNORMAL HIGH (ref 43–77)
PLATELETS: 403 10*3/uL — AB (ref 150–400)
RBC: 3.66 MIL/uL — ABNORMAL LOW (ref 4.22–5.81)
RDW: 13.8 % (ref 11.5–15.5)
WBC: 10 10*3/uL (ref 4.0–10.5)

## 2014-08-18 LAB — GLUCOSE, CAPILLARY
GLUCOSE-CAPILLARY: 159 mg/dL — AB (ref 70–99)
GLUCOSE-CAPILLARY: 142 mg/dL — AB (ref 70–99)
Glucose-Capillary: 125 mg/dL — ABNORMAL HIGH (ref 70–99)
Glucose-Capillary: 111 mg/dL — ABNORMAL HIGH (ref 70–99)
Glucose-Capillary: 124 mg/dL — ABNORMAL HIGH (ref 70–99)
Glucose-Capillary: 164 mg/dL — ABNORMAL HIGH (ref 70–99)

## 2014-08-18 LAB — BASIC METABOLIC PANEL
ANION GAP: 11 (ref 5–15)
BUN: 24 mg/dL — ABNORMAL HIGH (ref 6–23)
CO2: 19 mmol/L (ref 19–32)
Calcium: 9.4 mg/dL (ref 8.4–10.5)
Chloride: 103 mmol/L (ref 96–112)
Creatinine, Ser: 1.27 mg/dL (ref 0.50–1.35)
GFR calc non Af Amer: 53 mL/min — ABNORMAL LOW (ref >90)
GFR, EST AFRICAN AMERICAN: 62 mL/min — AB (ref >90)
Glucose, Bld: 128 mg/dL — ABNORMAL HIGH (ref 70–99)
POTASSIUM: 4.5 mmol/L (ref 3.5–5.1)
SODIUM: 133 mmol/L — AB (ref 135–145)

## 2014-08-18 LAB — AMYLASE: Amylase: 161 U/L — ABNORMAL HIGH (ref 0–105)

## 2014-08-18 LAB — BUN: BUN: 26 mg/dL — AB (ref 6–23)

## 2014-08-18 MED ORDER — DIATRIZOATE MEGLUMINE & SODIUM 66-10 % PO SOLN: 90.0000 mL | Freq: Once | ORAL | Status: DC

## 2014-08-18 MED ORDER — DIATRIZOATE MEGLUMINE & SODIUM 66-10 % PO SOLN
90.0000 mL | Freq: Once | ORAL | Status: AC
  Administered 2014-08-18: 90 mL via ORAL

## 2014-08-18 NOTE — Consult Note (Signed)
Reason for Consult: vomiting Referring Physician: Dr. Tinnie Gens   HPI: Frederick [name] is a 79 year old [place] speaking male who had a abdominal aortic aneurysm repair on 08/05/14 by Dr. Kellie Simmering.  The patient had an uneventful post operative course and was subsequently discharged home.  He was readmitted on the 21st with nausea and vomiting, found to have a post operative ileus.  He improved with conservative management and diet was resumed to full liquids at which time he began vomiting once again.  We have therefore been asked to evaluate the patient.  The patient has not passed flatus since the 23rd and his last bowel movement was on the 19th.  CT of abdomen and pelvis showed dilated small bowel loops consistent with post op ileus.  He has not had fevers.  Vitals are stable.  Labs reviewed.  He vomited this morning, dark enteric content.  He does not have a NGT at present time.  He does not appear in acute distress.  He is NPO    Past Medical History  Diagnosis Date  . Hyperlipidemia   . Hypertension   . Diabetes mellitus without complication     Type 2  . Constipation   . Arthritis     Past Surgical History  Procedure Laterality Date  . Abdominal adhesion surgery      for small bowel adhesions  . Thrombectomy femoral artery Left 06/25/2014    Procedure: Left Femoral Thrombectomy with Bilateral Leg Run Offs;  Surgeon: Conrad [place], MD;  Location: [place];  Service: Vascular;  Laterality: Left;  . Embolization Right 06/25/2014    Procedure: EMBOLIZATION;  Surgeon: Conrad Brushy, MD;  Location: [facility] CATH LAB;  Service: Cardiovascular;  Laterality: Right;  . Embolization N/A 07/06/2014    Procedure: EMBOLIZATION;  Surgeon: Conrad Fairview, MD;  Location: [facility] C[facility] LAB;  Service: Cardiovascular;  Laterality: N/A;  . Eye surgery      cataracts  . Colonoscopy    . Aortoiliac bypass N/A 08/05/2014    Procedure: RESECTION AND GRAFTING OF ABDOMINAL AORTIC ANEURYSM AND INSERTION OF AORTA TO RIGHT EXTERNAL  ILIAC & LEFT COMMON ILIAC WITH 14X 8 MM X40 CM HEMASHIELD GRAFT;  Surgeon: Mal Misty, MD;  Location: [facility] OR;  Service: Vascular;  Laterality: N/A;    Family History  Problem Relation Age of Onset  . Family history unknown: Yes    Social History:  reports that he quit smoking about 10 years ago. He does not have any smokeless tobacco history on file. He reports that he does not drink alcohol or use illicit drugs.  Allergies: No Known Allergies  Medications:  Scheduled Meds: . enoxaparin (LOVENOX) injection  40 mg Subcutaneous Q24H  . insulin aspart  0-9 Units Subcutaneous 6 times per day  . pantoprazole (PROTONIX) IV  40 mg Intravenous Daily   Continuous Infusions: . dextrose 5 % and 0.45 % NaCl with KCl 20 mEq/L 150 mL/hr at 08/18/14 0910   PRN Meds:.acetaminophen **OR** acetaminophen, bisacodyl, hydrALAZINE, labetalol, metoprolol, morphine injection, ondansetron, phenol, polyvinyl alcohol, promethazine   Results for orders placed or performed during the hospital encounter of 08/12/14 (from the past 48 hour(s))  Glucose, capillary     Status: Abnormal   Collection Time: 08/16/14 11:23 AM  Result Value Ref Range   Glucose-Capillary 118 (H) 70 - 99 mg/dL  Glucose, capillary     Status: Abnormal   Collection Time: 08/16/14  4:08 PM  Result Value Ref Range  Glucose-Capillary 129 (H) 70 - 99 mg/dL  Glucose, capillary     Status: Abnormal   Collection Time: 08/16/14  8:22 PM  Result Value Ref Range   Glucose-Capillary 113 (H) 70 - 99 mg/dL  Glucose, capillary     Status: Abnormal   Collection Time: 08/16/14 11:55 PM  Result Value Ref Range   Glucose-Capillary 106 (H) 70 - 99 mg/dL  Glucose, capillary     Status: Abnormal   Collection Time: 08/17/14  4:16 AM  Result Value Ref Range   Glucose-Capillary 109 (H) 70 - 99 mg/dL  Basic metabolic panel     Status: Abnormal   Collection Time: 08/17/14  5:25 AM  Result Value Ref Range   Sodium 135 135 - 145 mmol/L   Potassium  4.3 3.5 - 5.1 mmol/L   Chloride 105 96 - 112 mmol/L   CO2 23 19 - 32 mmol/L   Glucose, Bld 120 (H) 70 - 99 mg/dL   BUN 17 6 - 23 mg/dL   Creatinine, Ser 1.19 0.50 - 1.35 mg/dL   Calcium 9.1 8.4 - 10.5 mg/dL   GFR calc non Af Amer 58 (L) >90 mL/min   GFR calc Af Amer 67 (L) >90 mL/min    Comment: (NOTE) The eGFR has been calculated using the CKD EPI equation. This calculation has not been validated in all clinical situations. eGFR's persistently <90 mL/min signify possible Chronic Kidney Disease.    Anion gap 7 5 - 15  Glucose, capillary     Status: Abnormal   Collection Time: 08/17/14  7:54 AM  Result Value Ref Range   Glucose-Capillary 109 (H) 70 - 99 mg/dL  Glucose, capillary     Status: Abnormal   Collection Time: 08/17/14 11:39 AM  Result Value Ref Range   Glucose-Capillary 105 (H) 70 - 99 mg/dL  Glucose, capillary     Status: Abnormal   Collection Time: 08/17/14  4:34 PM  Result Value Ref Range   Glucose-Capillary 107 (H) 70 - 99 mg/dL  Glucose, capillary     Status: Abnormal   Collection Time: 08/17/14  9:03 PM  Result Value Ref Range   Glucose-Capillary 114 (H) 70 - 99 mg/dL  Glucose, capillary     Status: Abnormal   Collection Time: 08/18/14 12:25 AM  Result Value Ref Range   Glucose-Capillary 111 (H) 70 - 99 mg/dL  Glucose, capillary     Status: Abnormal   Collection Time: 08/18/14  4:15 AM  Result Value Ref Range   Glucose-Capillary 125 (H) 70 - 99 mg/dL  Basic metabolic panel     Status: Abnormal   Collection Time: 08/18/14  5:16 AM  Result Value Ref Range   Sodium 133 (L) 135 - 145 mmol/L   Potassium 4.5 3.5 - 5.1 mmol/L   Chloride 103 96 - 112 mmol/L   CO2 19 19 - 32 mmol/L   Glucose, Bld 128 (H) 70 - 99 mg/dL   BUN 24 (H) 6 - 23 mg/dL   Creatinine, Ser 1.27 0.50 - 1.35 mg/dL   Calcium 9.4 8.4 - 10.5 mg/dL   GFR calc non Af Amer 53 (L) >90 mL/min   GFR calc Af Amer 62 (L) >90 mL/min    Comment: (NOTE) The eGFR has been calculated using the CKD EPI  equation. This calculation has not been validated in all clinical situations. eGFR's persistently <90 mL/min signify possible Chronic Kidney Disease.    Anion gap 11 5 - 15  Glucose, capillary  Status: Abnormal   Collection Time: 08/18/14  8:12 AM  Result Value Ref Range   Glucose-Capillary 124 (H) 70 - 99 mg/dL   Comment 1 Notify RN    Comment 2 Documented in Chart   CBC with Differential/Platelet     Status: Abnormal   Collection Time: 08/18/14  9:30 AM  Result Value Ref Range   WBC 10.0 4.0 - 10.5 K/uL   RBC 3.66 (L) 4.22 - 5.81 MIL/uL   Hemoglobin 10.7 (L) 13.0 - 17.0 g/dL   HCT 31.7 (L) 39.0 - 52.0 %   MCV 86.6 78.0 - 100.0 fL   MCH 29.2 26.0 - 34.0 pg   MCHC 33.8 30.0 - 36.0 g/dL   RDW 13.8 11.5 - 15.5 %   Platelets 403 (H) 150 - 400 K/uL   Neutrophils Relative % 84 (H) 43 - 77 %   Neutro Abs 8.4 (H) 1.7 - 7.7 K/uL   Lymphocytes Relative 9 (L) 12 - 46 %   Lymphs Abs 0.9 0.7 - 4.0 K/uL   Monocytes Relative 7 3 - 12 %   Monocytes Absolute 0.7 0.1 - 1.0 K/uL   Eosinophils Relative 0 0 - 5 %   Eosinophils Absolute 0.0 0.0 - 0.7 K/uL   Basophils Relative 0 0 - 1 %   Basophils Absolute 0.0 0.0 - 0.1 K/uL    No results found.  Review of Systems  All other systems reviewed and are negative.  Blood pressure 109/67, pulse 76, temperature 98.7 F (37.1 C), temperature source Oral, resp. rate 16, height 5' 10"  (1.778 m), weight 141 lb 9.6 oz (64.229 kg), SpO2 98 %. Physical Exam  Constitutional: He is oriented to person, place, and time. He appears well-nourished. No distress.  Cardiovascular: Normal rate, regular rhythm, normal heart sounds and intact distal pulses.  Exam reveals no gallop and no friction rub.   No murmur heard. Respiratory: Breath sounds normal.  GI: Soft. Bowel sounds are normal. He exhibits no distension and no mass. There is no tenderness. There is no rebound and no guarding.  Midline incision-edges are approximated, staples in place, no erythema.    Musculoskeletal: Normal range of motion. He exhibits no edema or tenderness.  Neurological: He is alert and oriented to person, place, and time.  Skin: Skin is warm and dry. No rash noted. He is not diaphoretic. No erythema. No pallor.  Psychiatric: He has a normal mood and affect. His behavior is normal. Judgment and thought content normal.    Assessment/Plan: Nausea/vomiting: He likely has a post operative ileus, however, we cannot rule a bowel obstruction given his recent surgery.  His abdomen is soft, no acute abdomen and no role for surgical intervention.  We will proceed with the small bowel protocol;  1. Place NGT to LWIS for 2 hours 2. Obtain 90cc of Gastrografin 3. At 2 hours, give gastrograffin and CLAMP NGT for 1 hour. 4. After it has been clamped for 1 hour, place back to LWIS 5. Obtain abdominal XR 8 hours after gastrograffin has been given.  THIS IS A TIME SENSITIVE TEST.  PLEASE FOLLOW ALL DIRECTIONS CLOSELY.  PLEASE CALL [facility] [phone] WITH ANY QUESTIONS.  Dr. Ninfa Linden to call Dr. Kellie Simmering as requested.    Mareli Antunes ANP-BC 08/18/2014, 10:07 AM

## 2014-08-18 NOTE — Progress Notes (Signed)
Pt vomitted 700 mls.  NG tube placement reconfirmed, output continues.  VSS see flowsheet.  WIll page GI and report thoroughly.

## 2014-08-18 NOTE — Progress Notes (Addendum)
Patient ID: Frederick [name], male   DOB: 1936/02/13, 79 y.o.   MRN: [id number] Vascular Surgery Progress Note  Subjective: Persistent vomiting 2 weeks post resection and grafting of abdominal aortic aneurysm and right common iliac artery aneurysm. Patient readmitted with some suspected ileus 4 days ago. Had NG tube for a few days and now after NG tube removed continues to have vomiting with clear liquid diet and full liquid diet. Patient denies abdominal pain. Denies abdominal distention. He did have one bowel movement postoperatively and has had some flatus but not on a regular basis.  Objective:  Filed Vitals:   08/18/14 0449  BP: 109/67  Pulse: 76  Temp: 98.7 F (37.1 C)  Resp: 16    Gen. alert and oriented 3 Abdomen soft nontender with well-healed midline incision 3+ femoral and posterior tibial pulses palpable.  Patient vomited approximately 200 cc of brownish thin bilious-appearing fluid while I was at the bedside.   Labs:  Recent Labs Lab 08/16/14 0318 08/17/14 0525 08/18/14 0516  CREATININE 1.01 1.19 1.27    Recent Labs Lab 08/16/14 0318 08/17/14 0525 08/18/14 0516  NA 134* 135 133*  K 3.7 4.3 4.5  CL 103 105 103  CO2 23 23 19   BUN 11 17 24*  CREATININE 1.01 1.19 1.27  GLUCOSE 132* 120* 128*  CALCIUM 9.1 9.1 9.4    Recent Labs Lab 08/12/14 1913  WBC 6.6  HGB 10.2*  HCT 30.3*  PLT 337   No results for input(s): INR in the last 168 hours.  I/O last 3 completed shifts: In: 320 [P.O.:320] Out: 500 [Urine:500]  Imaging: No results found.  Assessment/Plan:  POD # 13  LOS: 6 days  s/p   Patient has had CT angiogram after readmission with no evidence of obstruction. No problems with aortic graft. Continues to have vomiting despite flaccid abdomen which is nontender Suspect possible high obstruction possibly a kink at ligament of Treitz-unsure of etiology of persistent vomiting  Plan-keep patient nothing by mouth Recheck flat and upright abdominal  x-rays Check amylase and CBC. Electrolytes done earlier today looked okay Will get general surgical consult for their opinion as to whether other diagnostic tests should be performed at this time Discussed this with patient's son by phone. Patient has limited [place] language understanding.   Josephina GipJames Lawson, MD 08/18/2014 9:30 AM

## 2014-08-18 NOTE — Progress Notes (Signed)
NG tube placed right nare per order, Pt tolerated fair.  Gastric contents draining at low intermittent suction.  Placement confirmed by auscultation.  Will con't plan of care.

## 2014-08-18 NOTE — Progress Notes (Signed)
Pt vomited large amount of clear-brown colored emesis this evening. Pt states he now feels better and would like to hold off on nausea medication at this time. Specimen saved if needed to be examined further. Pt currently resting with call bell in reach. Will cont to monitor.

## 2014-08-19 ENCOUNTER — Inpatient Hospital Stay (HOSPITAL_COMMUNITY): Payer: Medicare HMO

## 2014-08-19 LAB — BASIC METABOLIC PANEL
Anion gap: 8 (ref 5–15)
BUN: 30 mg/dL — AB (ref 6–23)
CHLORIDE: 98 mmol/L (ref 96–112)
CO2: 26 mmol/L (ref 19–32)
Calcium: 9.5 mg/dL (ref 8.4–10.5)
Creatinine, Ser: 1.37 mg/dL — ABNORMAL HIGH (ref 0.50–1.35)
GFR calc non Af Amer: 49 mL/min — ABNORMAL LOW (ref >90)
GFR, EST AFRICAN AMERICAN: 56 mL/min — AB (ref >90)
GLUCOSE: 169 mg/dL — AB (ref 70–99)
POTASSIUM: 4.1 mmol/L (ref 3.5–5.1)
SODIUM: 132 mmol/L — AB (ref 135–145)

## 2014-08-19 LAB — GLUCOSE, CAPILLARY
Glucose-Capillary: 121 mg/dL — ABNORMAL HIGH (ref 70–99)
Glucose-Capillary: 123 mg/dL — ABNORMAL HIGH (ref 70–99)
Glucose-Capillary: 130 mg/dL — ABNORMAL HIGH (ref 70–99)
Glucose-Capillary: 138 mg/dL — ABNORMAL HIGH (ref 70–99)
Glucose-Capillary: 142 mg/dL — ABNORMAL HIGH (ref 70–99)
Glucose-Capillary: 150 mg/dL — ABNORMAL HIGH (ref 70–99)

## 2014-08-19 MED ORDER — CLOTRIMAZOLE 10 MG MT TROC
10.0000 mg | Freq: Every day | OROMUCOSAL | Status: DC
  Administered 2014-08-19 – 2014-08-25 (×30): 10 mg via ORAL
  Filled 2014-08-19 (×35): qty 1

## 2014-08-19 MED ORDER — KCL IN DEXTROSE-NACL 20-5-0.45 MEQ/L-%-% IV SOLN
INTRAVENOUS | Status: DC
  Administered 2014-08-19: 23:00:00 via INTRAVENOUS
  Filled 2014-08-19 (×4): qty 1000

## 2014-08-19 MED ORDER — SODIUM CHLORIDE 0.9 % IV SOLN
INTRAVENOUS | Status: DC
  Administered 2014-08-19: 23:00:00 via INTRAVENOUS

## 2014-08-19 NOTE — Progress Notes (Signed)
Patient ID: Frederick [name], male   DOB: 1936-03-31, 79 y.o.   MRN: [id number]     [address] [place], [place] [address]    Phone: [phone] FAX: [phone]     Subjective: Vomited last evening.  2852m out of NGT bilious output.    Objective:  Vital signs:  Filed Vitals:   08/18/14 1335 08/18/14 1859 08/18/14 2004 08/19/14 0400  BP: 112/71 115/67 111/67 110/62  Pulse: 79  73 73  Temp: 98.6 F (37 C)  98.2 F (36.8 C) 98.7 F (37.1 C)  TempSrc: Oral  Oral Oral  Resp: _0 Height:      Weight:      SpO2: 99%  98% 99%    Last BM Date: 08/10/14  Intake/Output   Yesterday:  01/26 0701 - 01/27 0700 In: 120 [P.O.:120] Out: 3400 [Urine:550; Emesis/NG output:2850] This shift:  Total I/O In: -  Out: 100 [Emesis/NG output:100]   Physical Exam: General: Pt awake/alert/oriented x4 in no acute distress Chest: cta.  No chest wall pain w good excursion CV:  Pulses intact.  Regular rhythm MS: Normal AROM mjr joints.  No obvious deformity Abdomen: Soft.  Nondistended. Mildly tender at incisions only.  No evidence of peritonitis.  No incarcerated hernias. Ext:  SCDs BLE.  No mjr edema.  No cyanosis Skin: No petechiae / purpura   Problem List:   Active Problems:   Postoperative ileus    Results:   Labs: Results for orders placed or performed during the hospital encounter of 08/12/14 (from the past 48 hour(s))  Glucose, capillary     Status: Abnormal   Collection Time: 08/17/14  7:54 AM  Result Value Ref Range   Glucose-Capillary 109 (H) 70 - 99 mg/dL  Glucose, capillary     Status: Abnormal   Collection Time: 08/17/14 11:39 AM  Result Value Ref Range   Glucose-Capillary 105 (H) 70 - 99 mg/dL  Glucose, capillary     Status: Abnormal   Collection Time: 08/17/14  4:34 PM  Result Value Ref Range   Glucose-Capillary 107 (H) 70 - 99 mg/dL  Glucose, capillary     Status: Abnormal   Collection  Time: 08/17/14  9:03 PM  Result Value Ref Range   Glucose-Capillary 114 (H) 70 - 99 mg/dL  Glucose, capillary     Status: Abnormal   Collection Time: 08/18/14 12:25 AM  Result Value Ref Range   Glucose-Capillary 111 (H) 70 - 99 mg/dL  Glucose, capillary     Status: Abnormal   Collection Time: 08/18/14  4:15 AM  Result Value Ref Range   Glucose-Capillary 125 (H) 70 - 99 mg/dL  Basic metabolic panel     Status: Abnormal   Collection Time: 08/18/14  5:16 AM  Result Value Ref Range   Sodium 133 (L) 135 - 145 mmol/L   Potassium 4.5 3.5 - 5.1 mmol/L   Chloride 103 96 - 112 mmol/L   CO2 19 19 - 32 mmol/L   Glucose, Bld 128 (H) 70 - 99 mg/dL   BUN 24 (H) 6 - 23 mg/dL   Creatinine, Ser 1.27 0.50 - 1.35 mg/dL   Calcium 9.4 8.4 - 10.5 mg/dL   GFR calc non Af Amer 53 (L) >90 mL/min   GFR calc Af Amer 62 (L) >90 mL/min    Comment: (NOTE) The eGFR has been calculated using the CKD EPI equation. This calculation  has not been validated in all clinical situations. eGFR's persistently <90 mL/min signify possible Chronic Kidney Disease.    Anion gap 11 5 - 15  Glucose, capillary     Status: Abnormal   Collection Time: 08/18/14  8:12 AM  Result Value Ref Range   Glucose-Capillary 124 (H) 70 - 99 mg/dL   Comment 1 Notify RN    Comment 2 Documented in Chart   CBC with Differential/Platelet     Status: Abnormal   Collection Time: 08/18/14  9:30 AM  Result Value Ref Range   WBC 10.0 4.0 - 10.5 K/uL   RBC 3.66 (L) 4.22 - 5.81 MIL/uL   Hemoglobin 10.7 (L) 13.0 - 17.0 g/dL   HCT 31.7 (L) 39.0 - 52.0 %   MCV 86.6 78.0 - 100.0 fL   MCH 29.2 26.0 - 34.0 pg   MCHC 33.8 30.0 - 36.0 g/dL   RDW 13.8 11.5 - 15.5 %   Platelets 403 (H) 150 - 400 K/uL   Neutrophils Relative % 84 (H) 43 - 77 %   Neutro Abs 8.4 (H) 1.7 - 7.7 K/uL   Lymphocytes Relative 9 (L) 12 - 46 %   Lymphs Abs 0.9 0.7 - 4.0 K/uL   Monocytes Relative 7 3 - 12 %   Monocytes Absolute 0.7 0.1 - 1.0 K/uL   Eosinophils Relative 0 0 - 5  %   Eosinophils Absolute 0.0 0.0 - 0.7 K/uL   Basophils Relative 0 0 - 1 %   Basophils Absolute 0.0 0.0 - 0.1 K/uL  Amylase     Status: Abnormal   Collection Time: 08/18/14  9:30 AM  Result Value Ref Range   Amylase 161 (H) 0 - 105 U/L  Glucose, capillary     Status: Abnormal   Collection Time: 08/18/14 11:22 AM  Result Value Ref Range   Glucose-Capillary 159 (H) 70 - 99 mg/dL   Comment 1 Notify RN    Comment 2 Documented in Chart   BUN     Status: Abnormal   Collection Time: 08/18/14  1:02 PM  Result Value Ref Range   BUN 26 (H) 6 - 23 mg/dL  Glucose, capillary     Status: Abnormal   Collection Time: 08/18/14  4:12 PM  Result Value Ref Range   Glucose-Capillary 142 (H) 70 - 99 mg/dL   Comment 1 Notify RN    Comment 2 Documented in Chart   Glucose, capillary     Status: Abnormal   Collection Time: 08/18/14  7:59 PM  Result Value Ref Range   Glucose-Capillary 164 (H) 70 - 99 mg/dL   Comment 1 Documented in Chart    Comment 2 Notify RN   Glucose, capillary     Status: Abnormal   Collection Time: 08/19/14 12:05 AM  Result Value Ref Range   Glucose-Capillary 123 (H) 70 - 99 mg/dL   Comment 1 Documented in Chart    Comment 2 Notify RN   Glucose, capillary     Status: Abnormal   Collection Time: 08/19/14  3:59 AM  Result Value Ref Range   Glucose-Capillary 150 (H) 70 - 99 mg/dL   Comment 1 Documented in Chart    Comment 2 Notify RN   Basic metabolic panel     Status: Abnormal   Collection Time: 08/19/14  5:30 AM  Result Value Ref Range   Sodium 132 (L) 135 - 145 mmol/L   Potassium 4.1 3.5 - 5.1 mmol/L   Chloride 98 96 -  112 mmol/L   CO2 26 19 - 32 mmol/L   Glucose, Bld 169 (H) 70 - 99 mg/dL   BUN 30 (H) 6 - 23 mg/dL   Creatinine, Ser 1.37 (H) 0.50 - 1.35 mg/dL   Calcium 9.5 8.4 - 10.5 mg/dL   GFR calc non Af Amer 49 (L) >90 mL/min   GFR calc Af Amer 56 (L) >90 mL/min    Comment: (NOTE) The eGFR has been calculated using the CKD EPI equation. This calculation has not  been validated in all clinical situations. eGFR's persistently <90 mL/min signify possible Chronic Kidney Disease.    Anion gap 8 5 - 15    Imaging / Studies: Dg Abd Portable 1v  08/18/2014   CLINICAL DATA:  Small-bowel obstruction, 8 hr film  EXAM: PORTABLE ABDOMEN - 1 VIEW  COMPARISON:  08/13/2014  FINDINGS: Contrast material is noted within small bowel in the left abdomen. Scattered contrast is seen within the colon although similar in appearance to that noted on the prior exam. It is difficult to assess whether this represents just residual contrast within the right colon or passage of contrast from the recent administration. A film at 24 hr is recommended.  IMPRESSION: Contrast material within mildly dilated small bowel in left abdomen as well as within the right colon. This has a similar appearance in the right colon to that seen on the prior exam prior to the contrast administration and a follow-up film and 24 hr is recommended to assess for progress of the contrast from the small bowel in the left mid abdomen.   Electronically Signed   By: Inez Catalina M.D.   On: 08/18/2014 21:36    Medications / Allergies:  Scheduled Meds: . diatrizoate meglumine-sodium  90 mL Oral Once  . enoxaparin (LOVENOX) injection  40 mg Subcutaneous Q24H  . insulin aspart  0-9 Units Subcutaneous 6 times per day  . pantoprazole (PROTONIX) IV  40 mg Intravenous Daily   Continuous Infusions: . dextrose 5 % and 0.45 % NaCl with KCl 20 mEq/L 150 mL/hr at 08/19/14 0237   PRN Meds:.acetaminophen **OR** acetaminophen, bisacodyl, hydrALAZINE, labetalol, metoprolol, morphine injection, ondansetron, phenol, polyvinyl alcohol, promethazine  Antibiotics: Anti-infectives    None      Assessment/Plan Nausea and vomiting AXR shows contrast in SB and right colon.  Will repeat AXR tonight per protocol.  Cannot rule out an obstruction, will consult to GI for an endoscopy for further diagnosis(Dr. Deatra Ina to see).    Continue with NGT decompression NPO Intake and output Will follow  Erby Pian, ANP-BC [facility] Pager [phone](7A-4:30P) For consults and floor pages call [phone](7A-4:30P)  08/19/2014 7:53 AM

## 2014-08-19 NOTE — Progress Notes (Signed)
Removed staples per MD order per hospital policy.Patient tolerated well, no bleeding. Will continue to monitor

## 2014-08-19 NOTE — Progress Notes (Signed)
Patient ID: Frederick [name], male   DOB: 02-25-1936, 79 y.o.   MRN: [id number]  I'm fairly certain that he does not have a distal SBO.   His failure to improve is worrisome for a gastric outlet obstruction either at the ligament of Trietz from postop inflamation/adhesions or possibly a more proximal ulcer.  We have put in a consult to GI to consider upper endoscopy prior to operative intervention.

## 2014-08-19 NOTE — Consult Note (Signed)
[facility] Consult: 9:51 AM 08/19/2014  LOS: 7 days    Referring Provider: Dr Hart Rochester Primary Care Physician:  Simone Curia, MD in [place] Primary Gastroenterologist:  Gentry Fitz.   The interview was obtained using phone translator.  Pt has very limited english.   Reason for Consultation:  Nausea, vomiting   HPI: Frederick [name] is a 79 y.o. male.  Speaks [place].  Hx type 2 DM on oral agents at home. Hx SBO and s/p ex laparoscopy with LOA in 2011.  S/p 06/25/14 left iliofemoropopliteal and tibial thrombectomy, left CFA angioplasty.  S/p 08/05/14 AAA repair by Dr Hart Rochester. Readmitted 1/21 with n/v and ileus on films.  Improved with conservative mgt, but recurrent n/v once placed on full liquids. Last BM was 1/19  CT abdomen/pelvis: dilated loops of SB c/w post op ileus.  NGT was placed and output is increasing, was 3.4 liters (plus 700 cc emesis) 1/26.  Dr Magnus Ivan of gen surgery does not feel pt has SBO but is worried about GOO from postop inflammation vs adhesions vs ulcer. Wonders if endoscopy may help sort things out.  Pt has no precedent hx of GI problems: no heartburn, early satiety, dysphagia, nausea, anorexia. No constipation. Does say that he has 2 to 3 seconds events of shooting, electrical type, pain in the left abdomen. No persistent pain.      Past Medical History  Diagnosis Date  . Hyperlipidemia   . Hypertension   . Diabetes mellitus without complication     Type 2  . Constipation   . Arthritis     Past Surgical History  Procedure Laterality Date  . Abdominal adhesion surgery      for small bowel adhesions  . Thrombectomy femoral artery Left 06/25/2014    Procedure: Left Femoral Thrombectomy with Bilateral Leg Run Offs;  Surgeon: Fransisco Hertz, MD;  Location: [facility] OR;  Service: Vascular;  Laterality:  Left;  . Embolization Right 06/25/2014    Procedure: EMBOLIZATION;  Surgeon: Fransisco Hertz, MD;  Location: [facility] CATH LAB;  Service: Cardiovascular;  Laterality: Right;  . Embolization N/A 07/06/2014    Procedure: EMBOLIZATION;  Surgeon: Fransisco Hertz, MD;  Location: [facility] CATH LAB;  Service: Cardiovascular;  Laterality: N/A;  . Eye surgery      cataracts  . Colonoscopy    . Aortoiliac bypass N/A 08/05/2014    Procedure: RESECTION AND GRAFTING OF ABDOMINAL AORTIC ANEURYSM AND INSERTION OF AORTA TO RIGHT EXTERNAL ILIAC & LEFT COMMON ILIAC WITH 14X 8 MM X40 CM HEMASHIELD GRAFT;  Surgeon: Pryor Ochoa, MD;  Location: [facility] OR;  Service: Vascular;  Laterality: N/A;    Prior to Admission medications   Medication Sig Start Date End Date Taking? Authorizing Provider  aspirin EC 81 MG tablet Take 1 tablet (81 mg total) by mouth daily. 08/10/14  Yes Raymond Gurney, PA-C  fenofibrate (TRICOR) 145 MG tablet Take 145 mg by mouth daily.  05/20/14  Yes Historical Provider, MD  lisinopril (PRINIVIL,ZESTRIL) 5 MG tablet Take 5 mg by mouth daily.  05/20/14  Yes  Historical Provider, MD  metFORMIN (GLUCOPHAGE-XR) 500 MG 24 hr tablet Take 1 tablet (500 mg total) by mouth daily. Do not restart this medication until Sunday 07/05/14. 06/26/14  Yes Raymond Gurney, PA-C  omega-3 acid ethyl esters (LOVAZA) 1 G capsule Take 1 g by mouth 2 (two) times daily.  05/20/14  Yes Historical Provider, MD  pravastatin (PRAVACHOL) 20 MG tablet Take 20 mg by mouth daily.  05/18/14  Yes Historical Provider, MD    Scheduled Meds: . diatrizoate meglumine-sodium  90 mL Oral Once  . enoxaparin (LOVENOX) injection  40 mg Subcutaneous Q24H  . insulin aspart  0-9 Units Subcutaneous 6 times per day  . pantoprazole (PROTONIX) IV  40 mg Intravenous Daily   Infusions: . dextrose 5 % and 0.45 % NaCl with KCl 20 mEq/L 150 mL/hr at 08/19/14 0237   PRN Meds: acetaminophen **OR** acetaminophen, bisacodyl, hydrALAZINE, labetalol, metoprolol,  morphine injection, ondansetron, phenol, polyvinyl alcohol, promethazine   Allergies as of 08/12/2014  . (No Known Allergies)    Family History  Problem Relation Age of Onset  . Family history unknown: Yes    History   Social History  . Marital Status: Married    Spouse Name: N/A    Number of Children: N/A  . Years of Education: N/A   Occupational History  . Not on file.   Social History Main Topics  . Smoking status: Former Smoker -- 28 years    Quit date: 05/26/2004  . Smokeless tobacco: Not on file  . Alcohol Use: No  . Drug Use: No  . Sexual Activity: Not on file   Other Topics Concern  . Not on file   Social History Narrative    REVIEW OF SYSTEMS: Constitutional:  16 # weight loss in last 16 days ENT:  No nose bleeds, no discharge.  Nose and throat hurt due to NGT Pulm:  No SOB or cough CV:  No palpitations, no LE edema.  GU:  No hematuria, no frequency GI:  Per HPI Heme:  Per HPI.  Bruises along lap scar but no other unusual bleeding or bruising Transfusions:  None ever Neuro:  No headaches, no peripheral tingling or numbness Derm:  No itching, no rash or sores.  Endocrine:  No sweats or chills.  No polyuria or dysuria Immunization:  Not queried Travel:  None beyond local counties in last few months.    PHYSICAL EXAM: Vital signs in last 24 hours: Filed Vitals:   08/19/14 0400  BP: 110/62  Pulse: 73  Temp: 98.7 F (37.1 C)  Resp: 15   Wt Readings from Last 3 Encounters:  08/13/14 141 lb 9.6 oz (64.229 kg)  08/07/14 150 lb 12.7 oz (68.4 kg)  07/28/14 157 lb (71.215 kg)    General: Pleasant, tired-appearing but not acutely ill-looking Asian man. Head:  No asymmetry, no edema.  Eyes:  No icterus. Conjunctiva slightly pale. Ears:  Not HOH  Nose:  NG tube in place. Drainage is brownish yellow, clear, bilious. Mouth:  Yellow/white plaques covering entire tongue.   Neck:  Slight tenderness bil in throat area Lungs:  Clear bil.  unlabored  resps Heart: RRR.  No MRG Abdomen:  Flat, hypoactive BS, no tinkling or tympanitic sounds.  NT, ND.  Long, stapled, intact, midline scar with healing bruising present along the entire length.   Rectal: deeferred   Musc/Skeltl: no joint swelling or deformity or erythema Extremities:  No CCE  Neurologic:  Oriented x 3, no tremor.  No  limb weakness or asymmetric strength.   Skin:  No telangectasia, rash or sores.  Tattoos:  none Nodes:  No cervicaladenopathy   Psych:  Cooperative, pleasant.   Intake/Output from previous day: 01/26 0701 - 01/27 0700 In: 120 [P.O.:120] Out: 3400 [Urine:550; Emesis/NG output:2850] Intake/Output this shift: Total I/O In: -  Out: 100 [Emesis/NG output:100]  LAB RESULTS:  Recent Labs  08/18/14 0930  WBC 10.0  HGB 10.7*  HCT 31.7*  PLT 403*  MCV     86  BMET Lab Results  Component Value Date   NA 132* 08/19/2014   NA 133* 08/18/2014   NA 135 08/17/2014   K 4.1 08/19/2014   K 4.5 08/18/2014   K 4.3 08/17/2014   CL 98 08/19/2014   CL 103 08/18/2014   CL 105 08/17/2014   CO2 26 08/19/2014   CO2 19 08/18/2014   CO2 23 08/17/2014   GLUCOSE 169* 08/19/2014   GLUCOSE 128* 08/18/2014   GLUCOSE 120* 08/17/2014   BUN 30* 08/19/2014   BUN 26* 08/18/2014   BUN 24* 08/18/2014   CREATININE 1.37* 08/19/2014   CREATININE 1.27 08/18/2014   CREATININE 1.19 08/17/2014   CALCIUM 9.5 08/19/2014   CALCIUM 9.4 08/18/2014   CALCIUM 9.1 08/17/2014   LFT No results for input(s): PROT, ALBUMIN, AST, ALT, ALKPHOS, BILITOT, BILIDIR, IBILI in the last 72 hours. PT/INR Lab Results  Component Value Date   INR 1.40 08/05/2014   INR 1.12 08/05/2014   INR 1.12 06/25/2014   Hepatitis Panel No results for input(s): HEPBSAG, HCVAB, HEPAIGM, HEPBIGM in the last 72 hours. C-Diff No components found for: CDIFF Lipase     Component Value Date/Time   LIPASE 93* 08/12/2014 1913    Drugs of Abuse  No results found for: LABOPIA, COCAINSCRNUR, LABBENZ,  AMPHETMU, THCU, LABBARB   RADIOLOGY STUDIES: Dg Abd Portable 1v  08/18/2014   CLINICAL DATA:  Small-bowel obstruction, 8 hr film  EXAM: PORTABLE ABDOMEN - 1 VIEW  COMPARISON:  08/13/2014  FINDINGS: Contrast material is noted within small bowel in the left abdomen. Scattered contrast is seen within the colon although similar in appearance to that noted on the prior exam. It is difficult to assess whether this represents just residual contrast within the right colon or passage of contrast from the recent administration. A film at 24 hr is recommended.  IMPRESSION: Contrast material within mildly dilated small bowel in left abdomen as well as within the right colon. This has a similar appearance in the right colon to that seen on the prior exam prior to the contrast administration and a follow-up film and 24 hr is recommended to assess for progress of the contrast from the small bowel in the left mid abdomen.   Electronically Signed   By: Alcide Clever M.D.   On: 08/18/2014 21:36    ENDOSCOPIC STUDIES: Colonoscopy in [place] about 6 or 7 years ago Pt says no problems/pathology.  Does not recall GI MD's name.   IMPRESSION:   *  N/V post AAA repair.   Looks to have SB ileus but not distal SBO.  ? GOO due to ulcer or post-op ischemia.   *  Normocytic anemia. Occurrence post-op, not surprising.   *  Plaque on tongue, looks fungal.     PLAN:     *  Set up for EGD on 1/28 at ~12:30 PM.    *  Added Clotrimazole troches for what looks like oral candidiasis.   *  May need  to start TNA but let's delay til we pursue EGD.    Jennye MoccasinSarah Gribbin  08/19/2014, 9:51 AM Pager: [phone]     ________________________________________________________________________  [facility] note:  I personally examined the patient (limited interactions given language barrier), reviewed the data and agree with the assessment and plan described above.  Planning on EGD tomorrow AM.   Rob Buntinganiel Jacobs, MD [facility] Pager [phone]

## 2014-08-19 NOTE — Progress Notes (Addendum)
  Progress Note    08/19/2014 8:29 AM   Subjective:  Pt sleeping soundly  Afebrile VSS 99% RA  Filed Vitals:   08/19/14 0400  BP: 110/62  Pulse: 73  Temp: 98.7 F (37.1 C)  Resp: 15     CBC    Component Value Date/Time   WBC 10.0 08/18/2014 0930   RBC 3.66* 08/18/2014 0930   HGB 10.7* 08/18/2014 0930   HCT 31.7* 08/18/2014 0930   PLT 403* 08/18/2014 0930   MCV 86.6 08/18/2014 0930   MCH 29.2 08/18/2014 0930   MCHC 33.8 08/18/2014 0930   RDW 13.8 08/18/2014 0930   LYMPHSABS 0.9 08/18/2014 0930   MONOABS 0.7 08/18/2014 0930   EOSABS 0.0 08/18/2014 0930   BASOSABS 0.0 08/18/2014 0930    BMET    Component Value Date/Time   NA 132* 08/19/2014 0530   K 4.1 08/19/2014 0530   CL 98 08/19/2014 0530   CO2 26 08/19/2014 0530   GLUCOSE 169* 08/19/2014 0530   BUN 30* 08/19/2014 0530   CREATININE 1.37* 08/19/2014 0530   CALCIUM 9.5 08/19/2014 0530   GFRNONAA 49* 08/19/2014 0530   GFRAA 56* 08/19/2014 0530    INR    Component Value Date/Time   INR 1.40 08/05/2014 1224     Intake/Output Summary (Last 24 hours) at 08/19/14 0829 Last data filed at 08/19/14 0720  Gross per 24 hour  Intake      0 ml  Output   3150 ml  Net  -3150 ml     Assessment:  79 y.o. male is s/p:  AAA repair with aorto to right EIA and left CIA 08/05/13;    Plan: -pt sleeping-did not wake pt-will check back after GI consult is done -pt continues to have increased output from NGT -appreciate Gen surgery assistance -repeating AXR tonight-obstruction cannot be ruled out -GI to consult for further diagnosis -continue NPO -DVT prophylaxis:  Lovenox   Doreatha MassedSamantha Rhyne, PA-C [facility] [phone] 08/19/2014 8:29 AM  Patient awake and alert Abdomen remains soft and nontender 3+ distal pulses Patient vomited 700 cc last night with NG tube in place  For upper endoscopy today and further plans per Dr. Magnus IvanBlackman regarding possible exploratory lap Agreed that this  acts like a gastric outlet obstruction or very high small bowel obstruction

## 2014-08-20 ENCOUNTER — Encounter (HOSPITAL_COMMUNITY)
Admission: EM | Disposition: A | Discharge status: Home / Self Care | Payer: Self-pay | Source: Home / Self Care | Attending: Vascular Surgery

## 2014-08-20 ENCOUNTER — Encounter (HOSPITAL_COMMUNITY): Payer: Self-pay | Admitting: *Deleted

## 2014-08-20 ENCOUNTER — Inpatient Hospital Stay (HOSPITAL_COMMUNITY): Payer: Medicare HMO | Admitting: Anesthesiology

## 2014-08-20 HISTORY — PX: ESOPHAGOGASTRODUODENOSCOPY: SHX5428

## 2014-08-20 LAB — BASIC METABOLIC PANEL
Anion gap: 7 (ref 5–15)
BUN: 23 mg/dL (ref 6–23)
CO2: 23 mmol/L (ref 19–32)
Calcium: 8.9 mg/dL (ref 8.4–10.5)
Chloride: 102 mmol/L (ref 96–112)
Creatinine, Ser: 1.15 mg/dL (ref 0.50–1.35)
GFR, EST AFRICAN AMERICAN: 69 mL/min — AB (ref >90)
GFR, EST NON AFRICAN AMERICAN: 60 mL/min — AB (ref >90)
GLUCOSE: 136 mg/dL — AB (ref 70–99)
POTASSIUM: 4.7 mmol/L (ref 3.5–5.1)
SODIUM: 132 mmol/L — AB (ref 135–145)

## 2014-08-20 LAB — GLUCOSE, CAPILLARY
GLUCOSE-CAPILLARY: 140 mg/dL — AB (ref 70–99)
GLUCOSE-CAPILLARY: 107 mg/dL — AB (ref 70–99)
GLUCOSE-CAPILLARY: 115 mg/dL — AB (ref 70–99)
Glucose-Capillary: 120 mg/dL — ABNORMAL HIGH (ref 70–99)
Glucose-Capillary: 120 mg/dL — ABNORMAL HIGH (ref 70–99)
Glucose-Capillary: 122 mg/dL — ABNORMAL HIGH (ref 70–99)

## 2014-08-20 LAB — CBC
HCT: 31.4 % — ABNORMAL LOW (ref 39.0–52.0)
Hemoglobin: 10.5 g/dL — ABNORMAL LOW (ref 13.0–17.0)
MCH: 29.2 pg (ref 26.0–34.0)
MCHC: 33.4 g/dL (ref 30.0–36.0)
MCV: 87.5 fL (ref 78.0–100.0)
Platelets: 383 10*3/uL (ref 150–400)
RBC: 3.59 MIL/uL — ABNORMAL LOW (ref 4.22–5.81)
RDW: 13.7 % (ref 11.5–15.5)
WBC: 6.7 10*3/uL (ref 4.0–10.5)

## 2014-08-20 SURGERY — EGD (ESOPHAGOGASTRODUODENOSCOPY)
Anesthesia: Monitor Anesthesia Care

## 2014-08-20 MED ORDER — PROPOFOL 10 MG/ML IV BOLUS
INTRAVENOUS | Status: DC | PRN
  Administered 2014-08-20: 20 mg via INTRAVENOUS

## 2014-08-20 MED ORDER — PROMETHAZINE HCL 25 MG/ML IJ SOLN: 6.2500 mg | INTRAMUSCULAR | Status: DC | PRN

## 2014-08-20 MED ORDER — METOCLOPRAMIDE HCL 5 MG/ML IJ SOLN
10.0000 mg | Freq: Four times a day (QID) | INTRAMUSCULAR | Status: DC | PRN
  Filled 2014-08-20: qty 2

## 2014-08-20 MED ORDER — LACTATED RINGERS IV SOLN
INTRAVENOUS | Status: DC
  Administered 2014-08-20: 12:00:00 via INTRAVENOUS

## 2014-08-20 MED ORDER — MIDAZOLAM HCL 5 MG/5ML IJ SOLN
INTRAMUSCULAR | Status: DC | PRN
  Administered 2014-08-20 (×2): 1 mg via INTRAVENOUS

## 2014-08-20 MED ORDER — KCL IN DEXTROSE-NACL 20-5-0.45 MEQ/L-%-% IV SOLN
INTRAVENOUS | Status: DC
  Administered 2014-08-20 – 2014-08-24 (×8): via INTRAVENOUS
  Filled 2014-08-20 (×14): qty 1000

## 2014-08-20 MED ORDER — LACTATED RINGERS IV SOLN
INTRAVENOUS | Status: DC | PRN
  Administered 2014-08-20: 13:00:00 via INTRAVENOUS

## 2014-08-20 MED ORDER — PROPOFOL INFUSION 10 MG/ML OPTIME
INTRAVENOUS | Status: DC | PRN
  Administered 2014-08-20: 50 ug/kg/min via INTRAVENOUS

## 2014-08-20 NOTE — H&P (View-Only) (Signed)
                                                                           [facility] Consult: 9:51 AM 08/19/2014  LOS: 7 days    Referring Provider: Dr Lawson Primary Care Physician:  LEE,KEUNG, MD in  Primary Gastroenterologist:  Unassigned.   The interview was obtained using phone translator.  Pt has very limited english.   Reason for Consultation:  Nausea, vomiting   HPI: Frederick [name] is a 78 y.o. male.  Speaks [place].  Hx type 2 DM on oral agents at home. Hx SBO and s/p ex laparoscopy with LOA in 2011.  S/p 06/25/14 left iliofemoropopliteal and tibial thrombectomy, left CFA angioplasty.  S/p 08/05/14 AAA repair by Dr Lawson. Readmitted 1/21 with n/v and ileus on films.  Improved with conservative mgt, but recurrent n/v once placed on full liquids. Last BM was 1/19  CT abdomen/pelvis: dilated loops of SB c/w post op ileus.  NGT was placed and output is increasing, was 3.4 liters (plus 700 cc emesis) 1/26.  Dr Blackman of gen surgery does not feel pt has SBO but is worried about GOO from postop inflammation vs adhesions vs ulcer. Wonders if endoscopy may help sort things out.  Pt has no precedent hx of GI problems: no heartburn, early satiety, dysphagia, nausea, anorexia. No constipation. Does say that he has 2 to 3 seconds events of shooting, electrical type, pain in the left abdomen. No persistent pain.      Past Medical History  Diagnosis Date  . Hyperlipidemia   . Hypertension   . Diabetes mellitus without complication     Type 2  . Constipation   . Arthritis     Past Surgical History  Procedure Laterality Date  . Abdominal adhesion surgery      for small bowel adhesions  . Thrombectomy femoral artery Left 06/25/2014    Procedure: Left Femoral Thrombectomy with Bilateral Leg Run Offs;  Surgeon: Brian L Chen, MD;  Location: [facility] OR;  Service: Vascular;  Laterality:  Left;  . Embolization Right 06/25/2014    Procedure: EMBOLIZATION;  Surgeon: Brian L Chen, MD;  Location: [facility] CATH LAB;  Service: Cardiovascular;  Laterality: Right;  . Embolization N/A 07/06/2014    Procedure: EMBOLIZATION;  Surgeon: Brian L Chen, MD;  Location: [facility] CATH LAB;  Service: Cardiovascular;  Laterality: N/A;  . Eye surgery      cataracts  . Colonoscopy    . Aortoiliac bypass N/A 08/05/2014    Procedure: RESECTION AND GRAFTING OF ABDOMINAL AORTIC ANEURYSM AND INSERTION OF AORTA TO RIGHT EXTERNAL ILIAC & LEFT COMMON ILIAC WITH 14X 8 MM X40 CM HEMASHIELD GRAFT;  Surgeon: James D Lawson, MD;  Location: [facility] OR;  Service: Vascular;  Laterality: N/A;    Prior to Admission medications   Medication Sig Start Date End Date Taking? Authorizing Provider  aspirin EC 81 MG tablet Take 1 tablet (81 mg total) by mouth daily. 08/10/14  Yes Kimberly A Trinh, PA-C  fenofibrate (TRICOR) 145 MG tablet Take 145 mg by mouth daily.  05/20/14  Yes Historical Provider, MD  lisinopril (PRINIVIL,ZESTRIL) 5 MG tablet Take 5 mg by mouth daily.  05/20/14  Yes   Historical Provider, MD  metFORMIN (GLUCOPHAGE-XR) 500 MG 24 hr tablet Take 1 tablet (500 mg total) by mouth daily. Do not restart this medication until Sunday 07/05/14. 06/26/14  Yes Kimberly A Trinh, PA-C  omega-3 acid ethyl esters (LOVAZA) 1 G capsule Take 1 g by mouth 2 (two) times daily.  05/20/14  Yes Historical Provider, MD  pravastatin (PRAVACHOL) 20 MG tablet Take 20 mg by mouth daily.  05/18/14  Yes Historical Provider, MD    Scheduled Meds: . diatrizoate meglumine-sodium  90 mL Oral Once  . enoxaparin (LOVENOX) injection  40 mg Subcutaneous Q24H  . insulin aspart  0-9 Units Subcutaneous 6 times per day  . pantoprazole (PROTONIX) IV  40 mg Intravenous Daily   Infusions: . dextrose 5 % and 0.45 % NaCl with KCl 20 mEq/L 150 mL/hr at 08/19/14 0237   PRN Meds: acetaminophen **OR** acetaminophen, bisacodyl, hydrALAZINE, labetalol, metoprolol,  morphine injection, ondansetron, phenol, polyvinyl alcohol, promethazine   Allergies as of 08/12/2014  . (No Known Allergies)    Family History  Problem Relation Age of Onset  . Family history unknown: Yes    History   Social History  . Marital Status: Married    Spouse Name: N/A    Number of Children: N/A  . Years of Education: N/A   Occupational History  . Not on file.   Social History Main Topics  . Smoking status: Former Smoker -- 28 years    Quit date: 05/26/2004  . Smokeless tobacco: Not on file  . Alcohol Use: No  . Drug Use: No  . Sexual Activity: Not on file   Other Topics Concern  . Not on file   Social History Narrative    REVIEW OF SYSTEMS: Constitutional:  16 # weight loss in last 16 days ENT:  No nose bleeds, no discharge.  Nose and throat hurt due to NGT Pulm:  No SOB or cough CV:  No palpitations, no LE edema.  GU:  No hematuria, no frequency GI:  Per HPI Heme:  Per HPI.  Bruises along lap scar but no other unusual bleeding or bruising Transfusions:  None ever Neuro:  No headaches, no peripheral tingling or numbness Derm:  No itching, no rash or sores.  Endocrine:  No sweats or chills.  No polyuria or dysuria Immunization:  Not queried Travel:  None beyond local counties in last few months.    PHYSICAL EXAM: Vital signs in last 24 hours: Filed Vitals:   08/19/14 0400  BP: 110/62  Pulse: 73  Temp: 98.7 F (37.1 C)  Resp: 15   Wt Readings from Last 3 Encounters:  08/13/14 141 lb 9.6 oz (64.229 kg)  08/07/14 150 lb 12.7 oz (68.4 kg)  07/28/14 157 lb (71.215 kg)    General: Pleasant, tired-appearing but not acutely ill-looking Asian man. Head:  No asymmetry, no edema.  Eyes:  No icterus. Conjunctiva slightly pale. Ears:  Not HOH  Nose:  NG tube in place. Drainage is brownish yellow, clear, bilious. Mouth:  Yellow/white plaques covering entire tongue.   Neck:  Slight tenderness bil in throat area Lungs:  Clear bil.  unlabored  resps Heart: RRR.  No MRG Abdomen:  Flat, hypoactive BS, no tinkling or tympanitic sounds.  NT, ND.  Long, stapled, intact, midline scar with healing bruising present along the entire length.   Rectal: deeferred   Musc/Skeltl: no joint swelling or deformity or erythema Extremities:  No CCE  Neurologic:  Oriented x 3, no tremor.  No   limb weakness or asymmetric strength.   Skin:  No telangectasia, rash or sores.  Tattoos:  none Nodes:  No cervicaladenopathy   Psych:  Cooperative, pleasant.   Intake/Output from previous day: 01/26 0701 - 01/27 0700 In: 120 [P.O.:120] Out: 3400 [Urine:550; Emesis/NG output:2850] Intake/Output this shift: Total I/O In: -  Out: 100 [Emesis/NG output:100]  LAB RESULTS:  Recent Labs  08/18/14 0930  WBC 10.0  HGB 10.7*  HCT 31.7*  PLT 403*  MCV     86  BMET Lab Results  Component Value Date   NA 132* 08/19/2014   NA 133* 08/18/2014   NA 135 08/17/2014   K 4.1 08/19/2014   K 4.5 08/18/2014   K 4.3 08/17/2014   CL 98 08/19/2014   CL 103 08/18/2014   CL 105 08/17/2014   CO2 26 08/19/2014   CO2 19 08/18/2014   CO2 23 08/17/2014   GLUCOSE 169* 08/19/2014   GLUCOSE 128* 08/18/2014   GLUCOSE 120* 08/17/2014   BUN 30* 08/19/2014   BUN 26* 08/18/2014   BUN 24* 08/18/2014   CREATININE 1.37* 08/19/2014   CREATININE 1.27 08/18/2014   CREATININE 1.19 08/17/2014   CALCIUM 9.5 08/19/2014   CALCIUM 9.4 08/18/2014   CALCIUM 9.1 08/17/2014   LFT No results for input(s): PROT, ALBUMIN, AST, ALT, ALKPHOS, BILITOT, BILIDIR, IBILI in the last 72 hours. PT/INR Lab Results  Component Value Date   INR 1.40 08/05/2014   INR 1.12 08/05/2014   INR 1.12 06/25/2014   Hepatitis Panel No results for input(s): HEPBSAG, HCVAB, HEPAIGM, HEPBIGM in the last 72 hours. C-Diff No components found for: CDIFF Lipase     Component Value Date/Time   LIPASE 93* 08/12/2014 1913    Drugs of Abuse  No results found for: LABOPIA, COCAINSCRNUR, LABBENZ,  AMPHETMU, THCU, LABBARB   RADIOLOGY STUDIES: Dg Abd Portable 1v  08/18/2014   CLINICAL DATA:  Small-bowel obstruction, 8 hr film  EXAM: PORTABLE ABDOMEN - 1 VIEW  COMPARISON:  08/13/2014  FINDINGS: Contrast material is noted within small bowel in the left abdomen. Scattered contrast is seen within the colon although similar in appearance to that noted on the prior exam. It is difficult to assess whether this represents just residual contrast within the right colon or passage of contrast from the recent administration. A film at 24 hr is recommended.  IMPRESSION: Contrast material within mildly dilated small bowel in left abdomen as well as within the right colon. This has a similar appearance in the right colon to that seen on the prior exam prior to the contrast administration and a follow-up film and 24 hr is recommended to assess for progress of the contrast from the small bowel in the left mid abdomen.   Electronically Signed   By: Mark  Lukens M.D.   On: 08/18/2014 21:36    ENDOSCOPIC STUDIES: Colonoscopy in [place] about 6 or 7 years ago Pt says no problems/pathology.  Does not recall GI MD's name.   IMPRESSION:   *  N/V post AAA repair.   Looks to have SB ileus but not distal SBO.  ? GOO due to ulcer or post-op ischemia.   *  Normocytic anemia. Occurrence post-op, not surprising.   *  Plaque on tongue, looks fungal.     PLAN:     *  Set up for EGD on 1/28 at ~12:30 PM.    *  Added Clotrimazole troches for what looks like oral candidiasis.   *  May need   to start TNA but let's delay til we pursue EGD.    Sarah Gribbin  08/19/2014, 9:51 AM Pager: [phone]     ________________________________________________________________________  [facility] MD note:  I personally examined the patient (limited interactions given language barrier), reviewed the data and agree with the assessment and plan described above.  Planning on EGD tomorrow AM.   Yonael Tulloch, MD [facility] Pager [phone]  

## 2014-08-20 NOTE — Op Note (Signed)
[name], [id number]   ENDOSCOPY PROCEDURE REPORT  PATIENT: Frederick [name], Frederick M  MR#: [id number] BIRTHDATE: 1936-01-02 , 78  yrs. old GENDER: male ENDOSCOPIST: Rachael Feeaniel P Jacobs, MD REFERRED BY:  Abigail Miyamotoouglas Blackman, M.D. PROCEDURE DATE:  08/20/2014 PROCEDURE:  EGD w/ biopsy ASA CLASS:     Class III INDICATIONS:  vomiting, high NG output; recent aortic surgery, concern for gastric outlet or proximal small bowel obstruction. MEDICATIONS: Monitored anesthesia care TOPICAL ANESTHETIC: none  DESCRIPTION OF PROCEDURE: After the risks benefits and alternatives of the procedure were thoroughly explained, informed consent was obtained.  The Pentax Gastroscope [id number] endoscope was introduced through the mouth and advanced to the second portion of the duodenum , Without limitations.  The instrument was slowly withdrawn as the mucosa was fully examined.    The NG tube is causing minor esophagitis.  There was moderate pan-gastritis, this was biopsied distally.  The visualized small bowel was all normal.  I feel I was able to pass the scope into the proximal jejunum and all appeared normal.  Retroflexed views revealed no abnormalities.     The scope was then withdrawn from the patient and the procedure completed.  COMPLICATIONS: There were no immediate complications.  ENDOSCOPIC IMPRESSION: The NG tube is causing minor esophagitis.  There was moderate pan-gastritis, this was biopsied distally.  The visualized small bowel was all normal.  I feel I was able to pass the scope into the proximal jejunum and all appeared normal.  RECOMMENDATIONS: Will start reglan 10mg  IV q 6 hours.  If still very elevated NG output, then should proceed with UGI/SBFT (perhaps there is anatomic narrowing more distal that was visualized by upper endoscopy today).  eSigned:  Rachael Feeaniel P Jacobs, MD 08/20/2014 1:09 PM

## 2014-08-20 NOTE — Progress Notes (Signed)
Patient ID: Frederick [name], male   DOB: August 08, 1935, 79 y.o.   MRN: [id number]   Results of upper endo reviewed.  Given findings, will hold on surgery favoring that this may be more of an ileus and not an obstruction at the Ligament of Trietz. Will keep his NG until the morning and then pull it and start liquids.  I will leave it in today to continue to decompress what air may have been introduced with the eno.

## 2014-08-20 NOTE — Transfer of Care (Signed)
Immediate Anesthesia Transfer of Care Note  Patient: Frederick [name]  Procedure(s) Performed: Procedure(s): ESOPHAGOGASTRODUODENOSCOPY (EGD) (N/A)  Patient Location: Endoscopy Unit  Anesthesia Type:MAC  Level of Consciousness: awake, alert , oriented and patient cooperative  Airway & Oxygen Therapy: Patient Spontanous Breathing and Patient connected to nasal cannula oxygen  Post-op Assessment: Report given to PACU RN and Post -op Vital signs reviewed and stable  Post vital signs: Reviewed and stable  Last Vitals:  Filed Vitals:   08/20/14 1313  BP: 114/74  Pulse: 78  Temp:   Resp: 15    Complications: No apparent anesthesia complications

## 2014-08-20 NOTE — Anesthesia Preprocedure Evaluation (Addendum)
Anesthesia Evaluation  Patient identified by MRN, date of birth, ID band Patient awake    Reviewed: Allergy & Precautions, NPO status , Patient's Chart, lab work & pertinent test results  Airway Mallampati: II  TM Distance: >3 FB Neck ROM: Full    Dental no notable dental hx.    Pulmonary neg pulmonary ROS, former smoker,  breath sounds clear to auscultation  Pulmonary exam normal       Cardiovascular hypertension, Pt. on home beta blockers and Pt. on medications + Peripheral Vascular Disease Rhythm:Regular Rate:Normal     Neuro/Psych negative neurological ROS  negative psych ROS   GI/Hepatic negative GI ROS, Neg liver ROS,   Endo/Other  diabetes  Renal/GU negative Renal ROS  negative genitourinary   Musculoskeletal negative musculoskeletal ROS (+)   Abdominal   Peds negative pediatric ROS (+)  Hematology  (+) anemia ,   Anesthesia Other Findings   Reproductive/Obstetrics negative OB ROS                            Anesthesia Physical Anesthesia Plan  ASA: III  Anesthesia Plan: MAC   Post-op Pain Management:    Induction: Intravenous  Airway Management Planned: Nasal Cannula  Additional Equipment:   Intra-op Plan:   Post-operative Plan:   Informed Consent: I have reviewed the patients History and Physical, chart, labs and discussed the procedure including the risks, benefits and alternatives for the proposed anesthesia with the patient or authorized representative who has indicated his/her understanding and acceptance.   Dental advisory given  Plan Discussed with: CRNA and Surgeon  Anesthesia Plan Comments: (Will connect NGT to suction, if output still high will intubate. )       Anesthesia Quick Evaluation

## 2014-08-20 NOTE — Progress Notes (Signed)
08/20/2014 10:22 AM Nursing note Interpreter scheduled to be present at bedside during EGD. Pt. Updated on plan of care via interpreter line. Questions addressed. Will continue to monitor patient.  Rilyn Upshaw, [name]

## 2014-08-20 NOTE — Progress Notes (Signed)
Subjective: He denies abdominal pain Passing flatus/bm  Still with high output from NG  Objective: Vital signs in last 24 hours: Temp:  [97.4 F (36.3 C)-97.9 F (36.6 C)] 97.4 F (36.3 C) 08/28/22 0425) Pulse Rate:  [68-76] 68 08-28-2022 0425) Resp:  [16-18] 18 Aug 28, 2022 0425) BP: (105-110)/(60-72) 110/60 mmHg August 28, 2022 0425) SpO2:  [98 %-99 %] 98 % 08-28-2022 0425) Last BM Date: 08/11/14  Intake/Output from previous day: 01/27 0701 - 08/28/22 0700 In: -  Out: 1500 [Urine:550; Emesis/NG output:950] Intake/Output this shift:    Abdomen soft, non distended  Lab Results:   Recent Labs  08/18/14 0930  WBC 10.0  HGB 10.7*  HCT 31.7*  PLT 403*   BMET  Recent Labs  08/19/14 0530 2014/08/28 0650  NA 132* 132*  K 4.1 4.7  CL 98 102  CO2 26 23  GLUCOSE 169* 136*  BUN 30* 23  CREATININE 1.37* 1.15  CALCIUM 9.5 8.9   PT/INR No results for input(s): LABPROT, INR in the last 72 hours. ABG No results for input(s): PHART, HCO3 in the last 72 hours.  Invalid input(s): PCO2, PO2  Studies/Results: Dg Abd 2 Views  08/28/2014   CLINICAL DATA:  SBO protocol.  Vomiting.  EXAM: ABDOMEN - 2 VIEW  COMPARISON:  08/18/2014  FINDINGS: Residual contrast material is demonstrated throughout the colon. No significant residual contrast material in the small bowel. This suggest no evidence of high-grade complete obstruction. Partial obstruction not excluded. No significant gaseous distention of small bowel is demonstrated on today's examination. Colon is decompressed. Enteric tube is present in the upper mid abdomen consistent with location in the distal stomach. Degenerative changes in the spine and hips.  IMPRESSION: Contrast material progresses to the colon suggesting no evidence of high-grade small bowel obstruction. Bowel gas pattern today is normal. Enteric tube tip localizes to the distal stomach.   Electronically Signed   By: Burman Nieves M.D.   On: 2014-08-28 01:45   Dg Abd Portable  1v  08/18/2014   CLINICAL DATA:  Small-bowel obstruction, 8 hr film  EXAM: PORTABLE ABDOMEN - 1 VIEW  COMPARISON:  08/13/2014  FINDINGS: Contrast material is noted within small bowel in the left abdomen. Scattered contrast is seen within the colon although similar in appearance to that noted on the prior exam. It is difficult to assess whether this represents just residual contrast within the right colon or passage of contrast from the recent administration. A film at 24 hr is recommended.  IMPRESSION: Contrast material within mildly dilated small bowel in left abdomen as well as within the right colon. This has a similar appearance in the right colon to that seen on the prior exam prior to the contrast administration and a follow-up film and 24 hr is recommended to assess for progress of the contrast from the small bowel in the left mid abdomen.   Electronically Signed   By: Alcide Clever M.D.   On: 08/18/2014 21:36    Anti-infectives: Anti-infectives    None      Assessment/Plan: s/p Procedure(s): ESOPHAGOGASTRODUODENOSCOPY (EGD) (N/A)  SBO vs ileus  For upper endo today.  Hopefully GI can get to the ligament of Treitz to see is there is a partial obstruction there or any other proximal cause of his recurrent nausea, vomiting, and high NG output.  If completely negative and area of concern can be visualized, may be able to hold off on surgery.  If not, probably exp lap tomorrow  LOS: 8 days  Azyria Osmon A 08/20/2014

## 2014-08-20 NOTE — Interval H&P Note (Signed)
History and Physical Interval Note:  08/20/2014 12:27 PM  Frederick [name]  has presented today for surgery, with the diagnosis of Nausea, vomiting. Rule out GOO  The various methods of treatment have been discussed with the patient and family. After consideration of risks, benefits and other options for treatment, the patient has consented to  Procedure(s): ESOPHAGOGASTRODUODENOSCOPY (EGD) (N/A) as a surgical intervention .  The patient's history has been reviewed, patient examined, no change in status, stable for surgery.  I have reviewed the patient's chart and labs.  Questions were answered to the patient's satisfaction.     [name], Charisse Wendell P

## 2014-08-20 NOTE — Progress Notes (Addendum)
  [facility] Progress Note  08/20/2014 7:42 AM  Subjective:  No nausea or vomiting. Passing flatus. Denies abdominal pain. No BM.   Tmax 97.9 BP sys 100s-100s 02 99% RA  Filed Vitals:   08/20/14 0425  BP: 110/60  Pulse: 68  Temp: 97.4 F (36.3 C)  Resp: 18    Physical Exam: General: resting bed in NAD  Abdomen: midline abdominal incision clean dry and intact. Soft, non-tender, no distension. NG tube with bilious output. Extremities: palpable posterior tibial pulses bilaterally  CBC    Component Value Date/Time   WBC 10.0 08/18/2014 0930   RBC 3.66* 08/18/2014 0930   HGB 10.7* 08/18/2014 0930   HCT 31.7* 08/18/2014 0930   PLT 403* 08/18/2014 0930   MCV 86.6 08/18/2014 0930   MCH 29.2 08/18/2014 0930   MCHC 33.8 08/18/2014 0930   RDW 13.8 08/18/2014 0930   LYMPHSABS 0.9 08/18/2014 0930   MONOABS 0.7 08/18/2014 0930   EOSABS 0.0 08/18/2014 0930   BASOSABS 0.0 08/18/2014 0930    BMET    Component Value Date/Time   NA 132* 08/19/2014 0530   K 4.1 08/19/2014 0530   CL 98 08/19/2014 0530   CO2 26 08/19/2014 0530   GLUCOSE 169* 08/19/2014 0530   BUN 30* 08/19/2014 0530   CREATININE 1.37* 08/19/2014 0530   CALCIUM 9.5 08/19/2014 0530   GFRNONAA 49* 08/19/2014 0530   GFRAA 56* 08/19/2014 0530    INR    Component Value Date/Time   INR 1.40 08/05/2014 1224     Intake/Output Summary (Last 24 hours) at 08/20/14 0742 Last data filed at 08/20/14 0428  Gross per 24 hour  Intake      0 ml  Output   1250 ml  Net  -1250 ml     Assessment:  79 y.o. male is s/p: AAA repair with aorto to right EIA and left CIA 08/05/13, now with post-operative ileus. Possible gastric outlet obstruction?    Plan: -No nausea or vomiting today. Abdomen soft and non-tender.  -Repeat abdominal x-ray last night showing no evidence of high-grade SBO.  -Upper endoscopy today for further evaluation. ?Gastric outlet obstruction.  -Continue NGT. NPO. -Appreciate  general surgery and GI following.    Maris BergerKimberly Trinh, PA-C [facility] Office: [phone] Pager: [phone] 08/20/2014 7:42 AM     Upper endoscopy was able to pass the scope into the proximal jejunum without visualizing any obstruction, just inflammation.  Therefore, general surgery will hold on exploration, with plans to remove the NG tube in the morning and start liquids.  Durene CalWells Neera Teng

## 2014-08-20 NOTE — Progress Notes (Signed)
Utilization review completed.  

## 2014-08-20 NOTE — Progress Notes (Signed)
08/20/2014  0800 Left message with interpreter services to request in person [name] interpreter for EGD today. Will await call back. Kasean Denherder, [name]

## 2014-08-20 NOTE — Progress Notes (Signed)
Medicare Important Message given? YES  (If response is "NO", the following Medicare IM given date fields will be blank)  Date Medicare IM given: 08/20/14 Medicare IM given by:  Damilola Flamm  

## 2014-08-20 NOTE — Anesthesia Postprocedure Evaluation (Signed)
  Anesthesia Post-op Note  Patient: Frederick [name]  Procedure(s) Performed: Procedure(s): ESOPHAGOGASTRODUODENOSCOPY (EGD) (N/A)  Patient Location: Endoscopy Unit  Anesthesia Type:MAC  Level of Consciousness: awake, alert , oriented, patient cooperative and responds to stimulation  Airway and Oxygen Therapy: Patient Spontanous Breathing  Post-op Pain: none  Post-op Assessment: Post-op Vital signs reviewed, Patient's Cardiovascular Status Stable, Respiratory Function Stable, Patent Airway, No signs of Nausea or vomiting, Adequate PO intake, Pain level controlled, No headache, No backache, No residual numbness and No residual motor weakness  Post-op Vital Signs: Reviewed and stable  Last Vitals:  Filed Vitals:   08/20/14 1313  BP: 114/74  Pulse: 78  Temp:   Resp: 15    Complications: No apparent anesthesia complications

## 2014-08-21 ENCOUNTER — Encounter (HOSPITAL_COMMUNITY): Payer: Self-pay | Admitting: Gastroenterology

## 2014-08-21 LAB — BASIC METABOLIC PANEL
Anion gap: 4 — ABNORMAL LOW (ref 5–15)
BUN: 13 mg/dL (ref 6–23)
CO2: 26 mmol/L (ref 19–32)
CREATININE: 0.94 mg/dL (ref 0.50–1.35)
Calcium: 8.9 mg/dL (ref 8.4–10.5)
Chloride: 105 mmol/L (ref 96–112)
GFR calc Af Amer: >90 mL/min (ref >90)
GFR, EST NON AFRICAN AMERICAN: 79 mL/min — AB (ref >90)
Glucose, Bld: 119 mg/dL — ABNORMAL HIGH (ref 70–99)
Potassium: 4.5 mmol/L (ref 3.5–5.1)
Sodium: 135 mmol/L (ref 135–145)

## 2014-08-21 LAB — GLUCOSE, CAPILLARY
GLUCOSE-CAPILLARY: 125 mg/dL — AB (ref 70–99)
GLUCOSE-CAPILLARY: 123 mg/dL — AB (ref 70–99)
Glucose-Capillary: 106 mg/dL — ABNORMAL HIGH (ref 70–99)
Glucose-Capillary: 113 mg/dL — ABNORMAL HIGH (ref 70–99)
Glucose-Capillary: 116 mg/dL — ABNORMAL HIGH (ref 70–99)
Glucose-Capillary: 128 mg/dL — ABNORMAL HIGH (ref 70–99)

## 2014-08-21 MED ORDER — METOCLOPRAMIDE HCL 5 MG/ML IJ SOLN
5.0000 mg | Freq: Three times a day (TID) | INTRAMUSCULAR | Status: DC
  Administered 2014-08-21 – 2014-08-22 (×3): 5 mg via INTRAVENOUS
  Filled 2014-08-21 (×6): qty 1

## 2014-08-21 NOTE — Progress Notes (Signed)
Patient ID: Frederick [name], male   DOB: Mar 20, 1936, 79 y.o.   MRN: [id number]     [address] [place], [place] [address]    Phone: [phone] FAX: [phone]     Subjective: Passing flatus.  No BM.  No n/v.  Minimal NGT output. Communicated via [place] interpreter.  Objective:  Vital signs:  Filed Vitals:   08/20/14 1313 08/20/14 1401 08/20/14 1935 08/21/14 0413  BP: 114/74 118/74 117/60 111/67  Pulse: 78 74 76 74  Temp:  97.6 F (36.4 C) 98 F (36.7 C) 98.1 F (36.7 C)  TempSrc:  Oral Oral Oral  Resp: _0 Height:      Weight:      SpO2: 97% 100% 97% 97%    Last BM Date: 08/11/14  Intake/Output   Yesterday:  01/28 0701 - 01/29 0700 In: 200 [I.V.:200] Out: 2700 [Urine:2650; Emesis/NG output:50] This shift: I/O last 3 completed shifts: In: 200 [I.V.:200] Out: 3200 [Urine:3000; Emesis/NG output:200]     Physical Exam: General: Pt awake/alert/oriented x4 in no acute distress Abdomen: Soft.  Nondistended. Non tender.  +BS.  Midline incision healing.  No evidence of peritonitis.  No incarcerated hernias.   Problem List:   Active Problems:   Postoperative ileus    Results:   Labs: Results for orders placed or performed during the hospital encounter of 08/12/14 (from the past 48 hour(s))  Glucose, capillary     Status: Abnormal   Collection Time: 08/19/14  8:02 AM  Result Value Ref Range   Glucose-Capillary 130 (H) 70 - 99 mg/dL  Glucose, capillary     Status: Abnormal   Collection Time: 08/19/14 11:38 AM  Result Value Ref Range   Glucose-Capillary 142 (H) 70 - 99 mg/dL  Glucose, capillary     Status: Abnormal   Collection Time: 08/19/14  4:30 PM  Result Value Ref Range   Glucose-Capillary 138 (H) 70 - 99 mg/dL  Glucose, capillary     Status: Abnormal   Collection Time: 08/19/14  8:08 PM  Result Value Ref Range   Glucose-Capillary 121 (H) 70 - 99 mg/dL  Glucose, capillary      Status: Abnormal   Collection Time: 08/20/14 12:05 AM  Result Value Ref Range   Glucose-Capillary 122 (H) 70 - 99 mg/dL   Comment 1 Documented in Chart    Comment 2 Notify RN   Glucose, capillary     Status: Abnormal   Collection Time: 08/20/14  4:37 AM  Result Value Ref Range   Glucose-Capillary 120 (H) 70 - 99 mg/dL   Comment 1 Documented in Chart    Comment 2 Notify RN   Basic metabolic panel     Status: Abnormal   Collection Time: 08/20/14  6:50 AM  Result Value Ref Range   Sodium 132 (L) 135 - 145 mmol/L   Potassium 4.7 3.5 - 5.1 mmol/L   Chloride 102 96 - 112 mmol/L   CO2 23 19 - 32 mmol/L   Glucose, Bld 136 (H) 70 - 99 mg/dL   BUN 23 6 - 23 mg/dL   Creatinine, Ser 1.15 0.50 - 1.35 mg/dL   Calcium 8.9 8.4 - 10.5 mg/dL   GFR calc non Af Amer 60 (L) >90 mL/min   GFR calc Af Amer 69 (L) >90 mL/min    Comment: (NOTE) The eGFR has been calculated using the CKD EPI equation. This calculation has  not been validated in all clinical situations. eGFR's persistently <90 mL/min signify possible Chronic Kidney Disease.    Anion gap 7 5 - 15  Glucose, capillary     Status: Abnormal   Collection Time: 08/20/14  8:02 AM  Result Value Ref Range   Glucose-Capillary 140 (H) 70 - 99 mg/dL  Glucose, capillary     Status: Abnormal   Collection Time: 08/20/14 12:37 PM  Result Value Ref Range   Glucose-Capillary 107 (H) 70 - 99 mg/dL  CBC     Status: Abnormal   Collection Time: 08/20/14  2:43 PM  Result Value Ref Range   WBC 6.7 4.0 - 10.5 K/uL   RBC 3.59 (L) 4.22 - 5.81 MIL/uL   Hemoglobin 10.5 (L) 13.0 - 17.0 g/dL   HCT 31.4 (L) 39.0 - 52.0 %   MCV 87.5 78.0 - 100.0 fL   MCH 29.2 26.0 - 34.0 pg   MCHC 33.4 30.0 - 36.0 g/dL   RDW 13.7 11.5 - 15.5 %   Platelets 383 150 - 400 K/uL  Glucose, capillary     Status: Abnormal   Collection Time: 08/20/14  4:55 PM  Result Value Ref Range   Glucose-Capillary 115 (H) 70 - 99 mg/dL  Glucose, capillary     Status: Abnormal   Collection  Time: 08/20/14  7:33 PM  Result Value Ref Range   Glucose-Capillary 120 (H) 70 - 99 mg/dL  Glucose, capillary     Status: Abnormal   Collection Time: 08/21/14 12:13 AM  Result Value Ref Range   Glucose-Capillary 116 (H) 70 - 99 mg/dL  Glucose, capillary     Status: Abnormal   Collection Time: 08/21/14  4:16 AM  Result Value Ref Range   Glucose-Capillary 125 (H) 70 - 99 mg/dL  Basic metabolic panel     Status: Abnormal   Collection Time: 08/21/14  4:18 AM  Result Value Ref Range   Sodium 135 135 - 145 mmol/L   Potassium 4.5 3.5 - 5.1 mmol/L   Chloride 105 96 - 112 mmol/L   CO2 26 19 - 32 mmol/L   Glucose, Bld 119 (H) 70 - 99 mg/dL   BUN 13 6 - 23 mg/dL   Creatinine, Ser 0.94 0.50 - 1.35 mg/dL   Calcium 8.9 8.4 - 10.5 mg/dL   GFR calc non Af Amer 79 (L) >90 mL/min   GFR calc Af Amer >90 >90 mL/min    Comment: (NOTE) The eGFR has been calculated using the CKD EPI equation. This calculation has not been validated in all clinical situations. eGFR's persistently <90 mL/min signify possible Chronic Kidney Disease.    Anion gap 4 (L) 5 - 15    Imaging / Studies: Dg Abd 2 Views  08/20/2014   CLINICAL DATA:  SBO protocol.  Vomiting.  EXAM: ABDOMEN - 2 VIEW  COMPARISON:  08/18/2014  FINDINGS: Residual contrast material is demonstrated throughout the colon. No significant residual contrast material in the small bowel. This suggest no evidence of high-grade complete obstruction. Partial obstruction not excluded. No significant gaseous distention of small bowel is demonstrated on today's examination. Colon is decompressed. Enteric tube is present in the upper mid abdomen consistent with location in the distal stomach. Degenerative changes in the spine and hips.  IMPRESSION: Contrast material progresses to the colon suggesting no evidence of high-grade small bowel obstruction. Bowel gas pattern today is normal. Enteric tube tip localizes to the distal stomach.   Electronically Signed   By:  Gwyndolyn Saxon  Gerilyn Nestle M.D.   On: 08/20/2014 01:45    Medications / Allergies:  Scheduled Meds: . clotrimazole  10 mg Oral 5 X Daily  . diatrizoate meglumine-sodium  90 mL Oral Once  . enoxaparin (LOVENOX) injection  40 mg Subcutaneous Q24H  . insulin aspart  0-9 Units Subcutaneous 6 times per day  . pantoprazole (PROTONIX) IV  40 mg Intravenous Daily   Continuous Infusions: . dextrose 5 % and 0.45 % NaCl with KCl 20 mEq/L 150 mL/hr at 08/21/14 0059   PRN Meds:.acetaminophen **OR** acetaminophen, bisacodyl, hydrALAZINE, labetalol, metoCLOPramide (REGLAN) injection, metoprolol, morphine injection, ondansetron, phenol, polyvinyl alcohol, promethazine, promethazine  Antibiotics: Anti-infectives    None        Assessment/Plan Nausea and vomiting Ileus versus obstruction Favors ileus.  EGD did not show an obstruction at ligament of trietz. Will DC his NGT and start him on clears.  If negative, will obtain a UGI with SBFT.    Erby Pian, [facility] Surgery Pager [phone]) For consults and floor pages call [phone](7A-4:30P)  08/21/2014 7:38 AM

## 2014-08-21 NOTE — Progress Notes (Signed)
08/21/2014 9:08 AM Nursing note NGT d/c per orders. Pt. Tolerated well. Will continue to monitor patient.  [name], [name]

## 2014-08-21 NOTE — Progress Notes (Signed)
[facility] Progress Note    Since last GI note: EGD yesterday, no obvious obstruction (scope seemingly passed into jejunum).  I started him on scheduled reglan yesterday.  Objective: Vital signs in last 24 hours: Temp:  [97.6 F (36.4 C)-98.9 F (37.2 C)] 98.1 F (36.7 C) (01/29 0413) Pulse Rate:  [73-78] 74 (01/29 0413) Resp:  [15-19] 18 (01/29 0413) BP: (111-127)/(60-74) 111/67 mmHg (01/29 0413) SpO2:  [97 %-100 %] 97 % (01/29 0413) Last BM Date: 08/11/14 General: alert and oriented times 3 Heart: regular rate and rythm Abdomen: soft, non-tender, non-distended, normal bowel sounds   Lab Results:  Recent Labs  08/20/14 1443  WBC 6.7  HGB 10.5*  PLT 383  MCV 87.5    Recent Labs  08/19/14 0530 08/20/14 0650 08/21/14 0418  NA 132* 132* 135  K 4.1 4.7 4.5  CL 98 102 105  CO2 26 23 26   GLUCOSE 169* 136* 119*  BUN 30* 23 13  CREATININE 1.37* 1.15 0.94  CALCIUM 9.5 8.9 8.9   No results for input(s): PROT, ALBUMIN, AST, ALT, ALKPHOS, BILITOT, BILIDIR, IBILI in the last 72 hours. No results for input(s): INR in the last 72 hours.   Studies/Results: Dg Abd 2 Views  08/20/2014   CLINICAL DATA:  SBO protocol.  Vomiting.  EXAM: ABDOMEN - 2 VIEW  COMPARISON:  08/18/2014  FINDINGS: Residual contrast material is demonstrated throughout the colon. No significant residual contrast material in the small bowel. This suggest no evidence of high-grade complete obstruction. Partial obstruction not excluded. No significant gaseous distention of small bowel is demonstrated on today's examination. Colon is decompressed. Enteric tube is present in the upper mid abdomen consistent with location in the distal stomach. Degenerative changes in the spine and hips.  IMPRESSION: Contrast material progresses to the colon suggesting no evidence of high-grade small bowel obstruction. Bowel gas pattern today is normal. Enteric tube tip localizes to the distal stomach.   Electronically  Signed   By: Burman NievesWilliam  Stevens M.D.   On: 08/20/2014 01:45     Medications: Scheduled Meds: . clotrimazole  10 mg Oral 5 X Daily  . diatrizoate meglumine-sodium  90 mL Oral Once  . enoxaparin (LOVENOX) injection  40 mg Subcutaneous Q24H  . insulin aspart  0-9 Units Subcutaneous 6 times per day  . pantoprazole (PROTONIX) IV  40 mg Intravenous Daily   Continuous Infusions: . dextrose 5 % and 0.45 % NaCl with KCl 20 mEq/L 100 mL/hr (08/21/14 0819)   PRN Meds:.acetaminophen **OR** acetaminophen, bisacodyl, hydrALAZINE, labetalol, metoCLOPramide (REGLAN) injection, metoprolol, morphine injection, ondansetron, phenol, polyvinyl alcohol, promethazine, promethazine    Assessment/Plan: 79 y.o. male n/v after aortic surgery 2-3 weeks ago  I agree with pulling NG tube and observing him clinically as his diet advances.  Reglan ordered PRN mistakenly, will change this to scheduled dosing with meals (IV until he proves he can tolerate PO). Should continue for 1 week and then wean to PRN dosing only.   [name], Daniel P, MD  08/21/2014, 9:44 AM [facility] Pager [phone]

## 2014-08-21 NOTE — Progress Notes (Addendum)
  [facility] Progress Note  08/21/2014 7:38 AM 1 Day Post-Op  Subjective:  Doing well this am. No nausea. Passing flatus. No BM.   Tmax 98.9 BP sys 110s-120s 02 97% RA   Filed Vitals:   08/21/14 0413  BP: 111/67  Pulse: 74  Temp: 98.1 F (36.7 C)  Resp: 18    Physical Exam: Abdomen: Midline abdominal incision healing well. Abdomen soft and nontender Extremities:  2+ posterior tibial pulses bilaterally   CBC    Component Value Date/Time   WBC 6.7 08/20/2014 1443   RBC 3.59* 08/20/2014 1443   HGB 10.5* 08/20/2014 1443   HCT 31.4* 08/20/2014 1443   PLT 383 08/20/2014 1443   MCV 87.5 08/20/2014 1443   MCH 29.2 08/20/2014 1443   MCHC 33.4 08/20/2014 1443   RDW 13.7 08/20/2014 1443   LYMPHSABS 0.9 08/18/2014 0930   MONOABS 0.7 08/18/2014 0930   EOSABS 0.0 08/18/2014 0930   BASOSABS 0.0 08/18/2014 0930    BMET    Component Value Date/Time   NA 135 08/21/2014 0418   K 4.5 08/21/2014 0418   CL 105 08/21/2014 0418   CO2 26 08/21/2014 0418   GLUCOSE 119* 08/21/2014 0418   BUN 13 08/21/2014 0418   CREATININE 0.94 08/21/2014 0418   CALCIUM 8.9 08/21/2014 0418   GFRNONAA 79* 08/21/2014 0418   GFRAA >90 08/21/2014 0418    INR    Component Value Date/Time   INR 1.40 08/05/2014 1224     Intake/Output Summary (Last 24 hours) at 08/21/14 0738 Last data filed at 08/21/14 0700  Gross per 24 hour  Intake    200 ml  Output   2700 ml  Net  -2500 ml     Assessment:  79 y.o. male is s/p: AAA repair with aorto to right EIA and left CIA 08/05/13, now with post-operative ileus 1 Day Post-Op  Plan: -Upper endoscopy showing no obstruction at ligament of trietz. Likely ileus.  D/c NG tube and start on clears per general surgery.  -Appreciate general surgery and GI following.  -DM: controlled -Mobilize.    Maris BergerKimberly Trinh, PA-C [facility] Office: [phone] Pager: [phone] 08/21/2014 7:38 AM     Abdomen soft  nontender NG tube has been removed and patient has had no vomiting  so far  Dr. Magnus IvanBlackman continuing to follow-up regarding potential need for exploratory lap  Appreciate GI and general surgical help

## 2014-08-22 LAB — GLUCOSE, CAPILLARY
GLUCOSE-CAPILLARY: 115 mg/dL — AB (ref 70–99)
Glucose-Capillary: 125 mg/dL — ABNORMAL HIGH (ref 70–99)
Glucose-Capillary: 131 mg/dL — ABNORMAL HIGH (ref 70–99)
Glucose-Capillary: 113 mg/dL — ABNORMAL HIGH (ref 70–99)
Glucose-Capillary: 119 mg/dL — ABNORMAL HIGH (ref 70–99)

## 2014-08-22 MED ORDER — METOCLOPRAMIDE HCL 5 MG/ML IJ SOLN
10.0000 mg | Freq: Three times a day (TID) | INTRAMUSCULAR | Status: DC
  Administered 2014-08-22 – 2014-08-23 (×3): 10 mg via INTRAVENOUS
  Filled 2014-08-22 (×6): qty 2

## 2014-08-22 MED ORDER — MAGNESIUM HYDROXIDE 400 MG/5ML PO SUSP: 30.0000 mL | Freq: Every day | ORAL | Status: DC | PRN

## 2014-08-22 MED ORDER — ONDANSETRON HCL 4 MG/2ML IJ SOLN
4.0000 mg | Freq: Two times a day (BID) | INTRAMUSCULAR | Status: DC
  Administered 2014-08-23: 4 mg via INTRAVENOUS
  Filled 2014-08-22 (×4): qty 2

## 2014-08-22 NOTE — Progress Notes (Signed)
    Subjective  -   Complaining of nausea with reflux.   Physical Exam:  Abdomen is soft Incisions are well-healed.    Assessment/Plan:    I spoke with the patient via telephone interpreter.  He states that he is very uncomfortable with reflux symptoms and nausea.  He has not thrown up.  He is being followed by GI and general surgery.  There is consideration for small bowel follow through.  We'll try milk of magnesia to see if this helps with his reflux.  I spent approximately 30 minutes with the patient via telephone interpreter.  Temprance Wyre IV, V. WELLS 08/22/2014 10:14 AM --  Filed Vitals:   08/22/14 0300  BP: 113/66  Pulse: 72  Temp: 98.4 F (36.9 C)  Resp: 17    Intake/Output Summary (Last 24 hours) at 08/22/14 1014 Last data filed at 08/22/14 0426  Gross per 24 hour  Intake    120 ml  Output    950 ml  Net   -830 ml     Laboratory CBC    Component Value Date/Time   WBC 6.7 08/20/2014 1443   HGB 10.5* 08/20/2014 1443   HCT 31.4* 08/20/2014 1443   PLT 383 08/20/2014 1443    BMET    Component Value Date/Time   NA 135 08/21/2014 0418   K 4.5 08/21/2014 0418   CL 105 08/21/2014 0418   CO2 26 08/21/2014 0418   GLUCOSE 119* 08/21/2014 0418   BUN 13 08/21/2014 0418   CREATININE 0.94 08/21/2014 0418   CALCIUM 8.9 08/21/2014 0418   GFRNONAA 79* 08/21/2014 0418   GFRAA >90 08/21/2014 0418    COAG Lab Results  Component Value Date   INR 1.40 08/05/2014   INR 1.12 08/05/2014   INR 1.12 06/25/2014   No results found for: PTT  Antibiotics Anti-infectives    None       V. Charlena CrossWells Lakeidra Reliford IV, M.D. [facility] [place] Office: [phone] Pager:  [phone]

## 2014-08-22 NOTE — Progress Notes (Signed)
[facility] Progress Note    Since last GI note: Per RN, ++flatus, poor appetite on clears but no vomiting and no nausea  Objective: Vital signs in last 24 hours: Temp:  [98 F (36.7 C)-98.4 F (36.9 C)] 98.4 F (36.9 C) (01/30 0300) Pulse Rate:  [72-73] 72 (01/30 0300) Resp:  [16-17] 17 (01/30 0300) BP: (112-113)/(66-70) 113/66 mmHg (01/30 0300) SpO2:  [96 %-100 %] 96 % (01/30 0300) Last BM Date: 08/11/14 General: alert and oriented times 3 Heart: regular rate and rythm Abdomen: soft, non-tender, non-distended, normal bowel sounds   Lab Results:  Recent Labs  08/20/14 1443  WBC 6.7  HGB 10.5*  PLT 383  MCV 87.5    Recent Labs  08/20/14 0650 08/21/14 0418  NA 132* 135  K 4.7 4.5  CL 102 105  CO2 23 26  GLUCOSE 136* 119*  BUN 23 13  CREATININE 1.15 0.94  CALCIUM 8.9 8.9    Medications: Scheduled Meds: . clotrimazole  10 mg Oral 5 X Daily  . diatrizoate meglumine-sodium  90 mL Oral Once  . enoxaparin (LOVENOX) injection  40 mg Subcutaneous Q24H  . insulin aspart  0-9 Units Subcutaneous 6 times per day  . metoCLOPramide (REGLAN) injection  5 mg Intravenous TID AC  . pantoprazole (PROTONIX) IV  40 mg Intravenous Daily   Continuous Infusions: . dextrose 5 % and 0.45 % NaCl with KCl 20 mEq/L 100 mL/hr at 08/22/14 0600   PRN Meds:.acetaminophen **OR** acetaminophen, bisacodyl, hydrALAZINE, labetalol, metoprolol, morphine injection, ondansetron, phenol, polyvinyl alcohol, promethazine, promethazine    Assessment/Plan: 79 y.o. male with improved n/v now with poor appetite.  Felt due to prolonged post op ileus  Will try more appetizing liquids (full liquids).  Will increase reglan to 10mg  dosing. Will add scheduled zofran twice daily.  Encourage ambulation.    [name], Frederick Fasching P, MD  08/22/2014, 9:58 AM  Gastroenterology Pager [phone]

## 2014-08-22 NOTE — Progress Notes (Signed)
2 Days Post-Op  Subjective: I spent allot of time with him and phone interpreter.  It sounds like he is only getting water even thought clears are ordered.  He speaks no English to it is hard for everyone.  He feels very weak and says no one has helped him get up to walk.   It sounds like he is having flatus, no BM.  No nausea with NG out Objective: Vital signs in last 24 hours: Temp:  [98 F (36.7 C)-98.4 F (36.9 C)] 98.4 F (36.9 C) (01/30 0300) Pulse Rate:  [72-73] 72 (01/30 0300) Resp:  [16-17] 17 (01/30 0300) BP: (112-113)/(66-70) 113/66 mmHg (01/30 0300) SpO2:  [96 %-100 %] 96 % (01/30 0300) Last BM Date: 08/11/14 PO 600 recorded  Diet Clears No BM recorded Afebrile, VSS BMP Ok yesterday, K+ 4.5 SBO protocol film on 1/28 showed contrast in colon Intake/Output from previous day: 01/29 0701 - 01/30 0700 In: 600 [P.O.:600] Out: 1450 [Urine:1450] Intake/Output this shift:    General appearance: alert, cooperative, no distress and frail, looks like he's tired and weak GI: soft, non-tender; bowel sounds normal; no masses,  no organomegaly and some ecchymosis abd wall, few Bs, no distension.  incision healing well staples outl  Lab Results:   Recent Labs  08/20/14 1443  WBC 6.7  HGB 10.5*  HCT 31.4*  PLT 383    BMET  Recent Labs  08/20/14 0650 08/21/14 0418  NA 132* 135  K 4.7 4.5  CL 102 105  CO2 23 26  GLUCOSE 136* 119*  BUN 23 13  CREATININE 1.15 0.94  CALCIUM 8.9 8.9   PT/INR No results for input(s): LABPROT, INR in the last 72 hours.  No results for input(s): AST, ALT, ALKPHOS, BILITOT, PROT, ALBUMIN in the last 168 hours.   Lipase     Component Value Date/Time   LIPASE 93* 08/12/2014 1913     Studies/Results: No results found.  Medications: . clotrimazole  10 mg Oral 5 X Daily  . diatrizoate meglumine-sodium  90 mL Oral Once  . enoxaparin (LOVENOX) injection  40 mg Subcutaneous Q24H  . insulin aspart  0-9 Units Subcutaneous 6 times per  day  . metoCLOPramide (REGLAN) injection  5 mg Intravenous TID AC  . pantoprazole (PROTONIX) IV  40 mg Intravenous Daily    Assessment/Plan S/p AAA with post op ileus Recurrent nausea and vomiting EGD 08/20/14 pan gastritis, SB was normal and passed to proximal jejunum.   I talked with Dr. Christella HartiganJacobs, try some full liquids and work to mobilize him.  If this fails he may need SBFT tomorrow.    LOS: 10 days    Frederick [name] 08/22/2014

## 2014-08-23 LAB — GLUCOSE, CAPILLARY
GLUCOSE-CAPILLARY: 102 mg/dL — AB (ref 70–99)
GLUCOSE-CAPILLARY: 111 mg/dL — AB (ref 70–99)
GLUCOSE-CAPILLARY: 114 mg/dL — AB (ref 70–99)
GLUCOSE-CAPILLARY: 131 mg/dL — AB (ref 70–99)
GLUCOSE-CAPILLARY: 134 mg/dL — AB (ref 70–99)
Glucose-Capillary: 99 mg/dL (ref 70–99)
Glucose-Capillary: 113 mg/dL — ABNORMAL HIGH (ref 70–99)

## 2014-08-23 MED ORDER — PANTOPRAZOLE SODIUM 40 MG PO TBEC
40.0000 mg | DELAYED_RELEASE_TABLET | Freq: Every day | ORAL | Status: DC
  Administered 2014-08-23 – 2014-08-25 (×3): 40 mg via ORAL
  Filled 2014-08-23 (×3): qty 1

## 2014-08-23 MED ORDER — METOCLOPRAMIDE HCL 10 MG PO TABS
10.0000 mg | ORAL_TABLET | Freq: Three times a day (TID) | ORAL | Status: DC
  Administered 2014-08-23 – 2014-08-24 (×4): 10 mg via ORAL
  Filled 2014-08-23 (×8): qty 1

## 2014-08-23 NOTE — Progress Notes (Signed)
[facility] Progress Note    Since last GI note: Tolerating full liquids yesterday however this AM he vomited once (per tech).  He declined scheduled zofran last night since he was feeling so well.  Objective: Vital signs in last 24 hours: Temp:  [98 F (36.7 C)-98.7 F (37.1 C)] 98.5 F (36.9 C) (01/31 0346) Pulse Rate:  [70-76] 70 (01/31 0346) Resp:  [18] 18 (01/31 0346) BP: (119-120)/(64-66) 120/66 mmHg (01/31 0346) SpO2:  [96 %-100 %] 97 % (01/31 0346) Last BM Date: 08/11/14 General: alert and oriented times 3 Heart: regular rate and rythm Abdomen: soft, non-tender, non-distended, normal bowel sounds   Lab Results:  Recent Labs  08/20/14 1443  WBC 6.7  HGB 10.5*  PLT 383  MCV 87.5    Recent Labs  08/21/14 0418  NA 135  K 4.5  CL 105  CO2 26  GLUCOSE 119*  BUN 13  CREATININE 0.94  CALCIUM 8.9   Medications: Scheduled Meds: . clotrimazole  10 mg Oral 5 X Daily  . diatrizoate meglumine-sodium  90 mL Oral Once  . enoxaparin (LOVENOX) injection  40 mg Subcutaneous Q24H  . insulin aspart  0-9 Units Subcutaneous 6 times per day  . metoCLOPramide (REGLAN) injection  10 mg Intravenous TID AC  . ondansetron  4 mg Intravenous BID  . pantoprazole (PROTONIX) IV  40 mg Intravenous Daily   Continuous Infusions: . dextrose 5 % and 0.45 % NaCl with KCl 20 mEq/L 100 mL/hr at 08/23/14 0700   PRN Meds:.acetaminophen **OR** acetaminophen, bisacodyl, hydrALAZINE, labetalol, magnesium hydroxide, metoprolol, morphine injection, phenol, polyvinyl alcohol, promethazine, promethazine    Assessment/Plan: 79 y.o. male slowly improving from (probable) post op ileus  Will continue full liquids today, if further vomiting then he needs SBFT.  Will change to PO reglan, PO PPI for now.    [name], Daley Gosse P, MD  08/23/2014, 9:10 AM [facility] Pager [phone]

## 2014-08-23 NOTE — Progress Notes (Signed)
3 Days Post-Op  Subjective: He said he did well with full liquids, lots of flatus, no BM.  No discomfort.  Objective: Vital signs in last 24 hours: Temp:  [98 F (36.7 C)-98.7 F (37.1 C)] 98.5 F (36.9 C) (01/31 0346) Pulse Rate:  [70-76] 70 (01/31 0346) Resp:  [18] 18 (01/31 0346) BP: (119-120)/(64-66) 120/66 mmHg (01/31 0346) SpO2:  [96 %-100 %] 97 % (01/31 0346) Last BM Date: 08/11/14 480 PO recorded Afebrile, VSS No labs Intake/Output from previous day: 01/30 0701 - 01/31 0700 In: 7458.3 [P.O.:480; I.V.:6978.3] Out: 2050 [Urine:2050] Intake/Output this shift: Total I/O In: 360 [P.O.:360] Out: -   General appearance: alert, cooperative and no distress GI: soft, non-tender; bowel sounds normal; no masses,  no organomegaly and incision Ok  Lab Results:   Recent Labs  08/20/14 1443  WBC 6.7  HGB 10.5*  HCT 31.4*  PLT 383    BMET  Recent Labs  08/21/14 0418  NA 135  K 4.5  CL 105  CO2 26  GLUCOSE 119*  BUN 13  CREATININE 0.94  CALCIUM 8.9   PT/INR No results for input(s): LABPROT, INR in the last 72 hours.  No results for input(s): AST, ALT, ALKPHOS, BILITOT, PROT, ALBUMIN in the last 168 hours.   Lipase     Component Value Date/Time   LIPASE 93* 08/12/2014 1913     Studies/Results: No results found.  Medications: . clotrimazole  10 mg Oral 5 X Daily  . diatrizoate meglumine-sodium  90 mL Oral Once  . enoxaparin (LOVENOX) injection  40 mg Subcutaneous Q24H  . insulin aspart  0-9 Units Subcutaneous 6 times per day  . metoCLOPramide (REGLAN) injection  10 mg Intravenous TID AC  . ondansetron  4 mg Intravenous BID  . pantoprazole (PROTONIX) IV  40 mg Intravenous Daily    Assessment/Plan S/p AAA with post op ileus Recurrent nausea and vomiting EGD 08/20/14 pan gastritis, SB was normal and passed to proximal jejunum.   Plan:  I would continue Full liquids for today and if he does well go to soft diet tomorrow.  I told him he could go  home after he was tolerating soft diet and having BM's.  He still has not been ambulated.     LOS: 11 days    Keri Veale 08/23/2014

## 2014-08-23 NOTE — Progress Notes (Addendum)
   [facility]  Subjective  - Feels much better.     Objective 120/66 70 98.5 F (36.9 C) (Oral) 18 97%  Intake/Output Summary (Last 24 hours) at 08/23/14 0732 Last data filed at 08/22/14 2000  Gross per 24 hour  Intake    480 ml  Output   1500 ml  Net  -1020 ml    Abdomin soft positive bs Incision healing well  Assessment/Planning: 79 y.o. male is s/p: AAA repair with aorto to right EIA and left CIA 08/05/13, now with post-operative ileus  He is passing gas and tolerating a full liquid diet for > 48 hours without emesis. Nursing has given him Reglan TID and Protonix QD for last 12 hours.   I have saline locked his fluids and ordered PT for mobility.  Discussed mobility with nursing as well. May advance diet tomorrow pending next 24 hours or GI suggestions to advance his diet.    Clinton GallantCOLLINS, EMMA [facility] 08/23/2014 7:32 AM --  Laboratory Lab Results:  Recent Labs  08/20/14 1443  WBC 6.7  HGB 10.5*  HCT 31.4*  PLT 383   BMET  Recent Labs  08/21/14 0418  NA 135  K 4.5  CL 105  CO2 26  GLUCOSE 119*  BUN 13  CREATININE 0.94  CALCIUM 8.9    COAG Lab Results  Component Value Date   INR 1.40 08/05/2014   INR 1.12 08/05/2014   INR 1.12 06/25/2014   No results found for: PTT    I agree with the above.  The patient has been able to tolerate some liquids.  He did vomit once yesterday.  GI and Gen. surgery her closely following.  We will see how he does today.  If he continues to not make progress, he will require small bowel follow-through  Durene CalWells Schyler Butikofer

## 2014-08-23 NOTE — Evaluation (Addendum)
Physical Therapy Evaluation Patient Details Name: Frederick [name] MRN: [id number] DOB: 29-Nov-1935 Today's Date: 08/23/2014   History of Present Illness  Pt admitted with post op ileus following AAA repair ~ 1 week ago.  Clinical Impression  Pt admitted with above diagnosis. Pt currently with functional limitations due to the deficits listed below (see PT Problem List).  Pt will benefit from skilled PT to increase their independence and safety with mobility to allow discharge to the venue listed below.  Interpreter used via phone.  Interpreter [phone].     Follow Up Recommendations Home health PT;Supervision for mobility/OOB    Equipment Recommendations  None recommended by PT    Recommendations for Other Services       Precautions / Restrictions Precautions Precautions: None      Mobility  Bed Mobility       Sidelying to sit: Supervision Supine to sit: Supervision     General bed mobility comments: supervision for safety only  Transfers   Equipment used: Rolling walker (2 wheeled)   Sit to Stand: Min guard Stand pivot transfers: Min guard       General transfer comment: min guard for safety  Ambulation/Gait Ambulation/Gait assistance: Min guard Ambulation Distance (Feet): 450 Feet Assistive device: Rolling walker (2 wheeled) Gait Pattern/deviations: Step-through pattern;Decreased stride length;Trunk flexed Gait velocity: mildly decreased      Stairs            Wheelchair Mobility    Modified Rankin (Stroke Patients Only)       Balance                                             Pertinent Vitals/Pain Pain Assessment: No/denies pain    Home Living Family/patient expects to be discharged to:: Private residence Living Arrangements: Spouse/significant other Available Help at Discharge: Family;Available PRN/intermittently Type of Home: Apartment Home Access: Stairs to enter   Entrance Stairs-Number of Steps: 3 Home Layout:  Two level;1/2 bath on main level Home Equipment: Walker - 2 wheels      Prior Function Level of Independence: Independent         Comments: pt walked every day, son reports pt as active     Hand Dominance   Dominant Hand: Right    Extremity/Trunk Assessment   Upper Extremity Assessment: Generalized weakness           Lower Extremity Assessment: Generalized weakness      Cervical / Trunk Assessment: Normal  Communication   Communication: Prefers language other than English ([place])  Cognition Arousal/Alertness: Awake/alert Behavior During Therapy: WFL for tasks assessed/performed Overall Cognitive Status: Within Functional Limits for tasks assessed                      General Comments      Exercises        Assessment/Plan    PT Assessment Patient needs continued PT services  PT Diagnosis Difficulty walking;Generalized weakness   PT Problem List Decreased strength;Decreased activity tolerance;Decreased balance;Decreased mobility  PT Treatment Interventions DME instruction;Gait training;Stair training;Functional mobility training;Therapeutic activities;Therapeutic exercise;Balance training;Patient/family education   PT Goals (Current goals can be found in the Care Plan section) Acute Rehab PT Goals Patient Stated Goal: to go home PT Goal Formulation: With patient Time For Goal Achievement: 09/06/14 Potential to Achieve Goals: Good    Frequency Min 3X/week  Barriers to discharge        Co-evaluation               End of Session Equipment Utilized During Treatment: Gait belt Activity Tolerance: Patient tolerated treatment well Patient left: in chair;with call bell/phone within reach Nurse Communication: Mobility status         Time: [id number] PT Time Calculation (min) (ACUTE ONLY): 24 min   Charges:   PT Evaluation $Initial PT Evaluation Tier I: 1 Procedure PT Treatments $Gait Training: 8-22 mins   PT G Codes:         Ilda FoilGarrow, Petrona Wyeth Rene 08/23/2014, 10:47 AM

## 2014-08-23 NOTE — Progress Notes (Signed)
Patient ambulated 550 feet with RW on RA. Patient tolerated well. Patient resting in chair with call bell within reach.

## 2014-08-24 ENCOUNTER — Encounter (HOSPITAL_COMMUNITY): Payer: Self-pay | Admitting: *Deleted

## 2014-08-24 ENCOUNTER — Encounter: Payer: Self-pay | Admitting: Vascular Surgery

## 2014-08-24 DIAGNOSIS — K913 Postprocedural intestinal obstruction: Principal | ICD-10-CM

## 2014-08-24 LAB — GLUCOSE, CAPILLARY
GLUCOSE-CAPILLARY: 122 mg/dL — AB (ref 70–99)
GLUCOSE-CAPILLARY: 108 mg/dL — AB (ref 70–99)
GLUCOSE-CAPILLARY: 124 mg/dL — AB (ref 70–99)
Glucose-Capillary: 103 mg/dL — ABNORMAL HIGH (ref 70–99)
Glucose-Capillary: 138 mg/dL — ABNORMAL HIGH (ref 70–99)
Glucose-Capillary: 114 mg/dL — ABNORMAL HIGH (ref 70–99)

## 2014-08-24 MED ORDER — METOCLOPRAMIDE HCL 10 MG PO TABS
10.0000 mg | ORAL_TABLET | Freq: Three times a day (TID) | ORAL | Status: DC
  Administered 2014-08-24 – 2014-08-25 (×4): 10 mg via ORAL
  Filled 2014-08-24 (×6): qty 1

## 2014-08-24 NOTE — Progress Notes (Signed)
          Daily Rounding Note  08/24/2014, 9:08 AM  LOS: 12 days   SUBJECTIVE:       Pt says he is likely to go home tomorrow.  No abdominal pain.  No nausea with full liquids.     OBJECTIVE:         Vital signs in last 24 hours:    Temp:  [97.9 F (36.6 C)-98.4 F (36.9 C)] 98.4 F (36.9 C) (02/01 0439) Pulse Rate:  [72-77] 77 (02/01 0439) Resp:  [18] 18 (02/01 0439) BP: (99-107)/(64-68) 104/65 mmHg (02/01 0439) SpO2:  [95 %-100 %] 95 % (02/01 0439) Last BM Date: 08/11/14 Filed Weights   08/13/14 0100  Weight: 141 lb 9.6 oz (64.229 kg)   General: pleasant, looks well   Heart: RRR Chest: clear bil Abdomen: soft.  NT, ND.  Active BS and none tinkling or tympanitic  Extremities: no CCE Neuro/Psych:  Pleasant, appropriate, follows commands.   Intake/Output from previous day: 01/31 0701 - 02/01 0700 In: 1455 [P.O.:840; I.V.:615] Out: 1200 [Urine:1200]  Intake/Output this shift:    Lab Results: No results for input(s): WBC, HGB, HCT, PLT in the last 72 hours. BMET No results for input(s): NA, K, CL, CO2, GLUCOSE, BUN, CREATININE, CALCIUM in the last 72 hours. LFT No results for input(s): PROT, ALBUMIN, AST, ALT, ALKPHOS, BILITOT, BILIDIR, IBILI in the last 72 hours. PT/INR No results for input(s): LABPROT, INR in the last 72 hours. Hepatitis Panel No results for input(s): HEPBSAG, HCVAB, HEPAIGM, HEPBIGM in the last 72 hours.  Studies/Results: No results found.   Scheduled Meds: . clotrimazole  10 mg Oral 5 X Daily  . diatrizoate meglumine-sodium  90 mL Oral Once  . enoxaparin (LOVENOX) injection  40 mg Subcutaneous Q24H  . insulin aspart  0-9 Units Subcutaneous 6 times per day  . metoCLOPramide  10 mg Oral TID AC & HS  . ondansetron  4 mg Intravenous BID  . pantoprazole  40 mg Oral Q0600   Continuous Infusions: . dextrose 5 % and 0.45 % NaCl with KCl 20 mEq/L 50 mL/hr at 08/23/14 2033   PRN  Meds:.acetaminophen **OR** acetaminophen, bisacodyl, hydrALAZINE, labetalol, magnesium hydroxide, metoprolol, morphine injection, phenol, polyvinyl alcohol, promethazine, promethazine   ASSESMENT:   *  Post op ileus.  Pan gastritis on EGD of 1/28    PLAN   *  Agree with advancing to soft diet.  *  Switch to BID Reglan po.  Would d/c Reglan after a few days. Stopping HS dose now.  *   Po Protonix at home.  *  No need for SBFT.  GI will sign off.  No need for GI follow up.      Jennye MoccasinSarah Claiborne Stroble  08/24/2014, 9:08 AM Pager: [phone]

## 2014-08-24 NOTE — Progress Notes (Signed)
Utilization review completed.  

## 2014-08-24 NOTE — Progress Notes (Signed)
Patient ID: Frederick [name], male   DOB: [birth year], 79 y.o.   MRN: [id number]     [facility]      [address]   [place], [place] [id number]    Phone: [phone] FAX: [phone]     Subjective: no nausea or vomiting.  Tolerated liquids. Passing flatus. No BM yet.  Objective:  Vital signs:  Filed Vitals:   08/23/14 0346 08/23/14 1332 08/23/14 2126 08/24/14 0439  BP: 120/66 107/68 99/64 104/65  Pulse: 70 75 72 77  Temp: 98.5 F (36.9 C) 98 F (36.7 C) 97.9 F (36.6 C) 98.4 F (36.9 C)  TempSrc: Oral Oral Oral Oral  Resp: 18 18 18 18   Height:      Weight:      SpO2: 97% 100% 98% 95%    Last BM Date: 08/11/14  Intake/Output   Yesterday:  01/31 0701 - 02/01 0700 In: 1455 [P.O.:840; I.V.:615] Out: 1200 [Urine:1200] This shift: I/O last 3 completed shifts: In: 8433.3 [P.O.:840; I.V.:7593.3] Out: 2150 [Urine:2150]     Physical Exam: General: Pt awake/alert/oriented x4 in no acute distress  Abdomen: Soft.  Nondistended.  Nontender.  No evidence of peritonitis.  No incarcerated hernias.   Problem List:   Active Problems:   Postoperative ileus    Results:   Labs: Results for orders placed or performed during the hospital encounter of 08/12/14 (from the past 48 hour(s))  Glucose, capillary     Status: Abnormal   Collection Time: 08/22/14 11:38 AM  Result Value Ref Range   Glucose-Capillary 115 (H) 70 - 99 mg/dL  Glucose, capillary     Status: Abnormal   Collection Time: 08/22/14  4:25 PM  Result Value Ref Range   Glucose-Capillary 113 (H) 70 - 99 mg/dL   Comment 1 Notify RN   Glucose, capillary     Status: Abnormal   Collection Time: 08/22/14  8:40 PM  Result Value Ref Range   Glucose-Capillary 119 (H) 70 - 99 mg/dL  Glucose, capillary     Status: Abnormal   Collection Time: 08/22/14 11:44 PM  Result Value Ref Range   Glucose-Capillary 114 (H) 70 - 99 mg/dL  Glucose, capillary     Status: Abnormal   Collection Time: 08/23/14  4:40 AM  Result Value Ref Range   Glucose-Capillary 131 (H) 70 - 99 mg/dL  Glucose, capillary     Status: Abnormal   Collection Time: 08/23/14  8:11 AM  Result Value Ref Range   Glucose-Capillary 134 (H) 70 - 99 mg/dL  Glucose, capillary     Status: Abnormal   Collection Time: 08/23/14 12:09 PM  Result Value Ref Range   Glucose-Capillary 111 (H) 70 - 99 mg/dL  Glucose, capillary     Status: None   Collection Time: 08/23/14  4:10 PM  Result Value Ref Range   Glucose-Capillary 99 70 - 99 mg/dL  Glucose, capillary     Status: Abnormal   Collection Time: 08/23/14  8:06 PM  Result Value Ref Range   Glucose-Capillary 113 (H) 70 - 99 mg/dL  Glucose, capillary     Status: Abnormal   Collection Time: 08/23/14 11:53 PM  Result Value Ref Range   Glucose-Capillary 102 (H) 70 - 99 mg/dL  Glucose, capillary     Status: Abnormal   Collection Time: 08/24/14  4:35 AM  Result Value Ref Range   Glucose-Capillary 103 (H) 70 - 99 mg/dL    Imaging / Studies: No results found.  Medications / Allergies:  Scheduled Meds: . clotrimazole  10 mg Oral 5 X Daily  . diatrizoate meglumine-sodium  90 mL Oral Once  . enoxaparin (LOVENOX) injection  40 mg Subcutaneous Q24H  . insulin aspart  0-9 Units Subcutaneous 6 times per day  . metoCLOPramide  10 mg Oral TID AC & HS  . ondansetron  4 mg Intravenous BID  . pantoprazole  40 mg Oral Q0600   Continuous Infusions: . dextrose 5 % and 0.45 % NaCl with KCl 20 mEq/L 50 mL/hr at 08/23/14 2033   PRN Meds:.acetaminophen **OR** acetaminophen, bisacodyl, hydrALAZINE, labetalol, magnesium hydroxide, metoprolol, morphine injection, phenol, polyvinyl alcohol, promethazine, promethazine  Antibiotics: Anti-infectives    None        Assessment/Plan Nausea and vomiting Post op ileus -advance to [facility] for lunch.  If tolerated, soft diet for dinner -mobilize -anticipate DC if he does well today  Ashok Norris, [facility] Pager [phone](7A-4:30P) For consults and floor pages call [phone](7A-4:30P)  08/24/2014 8:35 AM

## 2014-08-24 NOTE — Progress Notes (Signed)
Spoke with NP Emina and is okay for patient to have soft diet for lunch since he tolerated his FLD for breakfast.Kaynen Minner S 9:09 AM

## 2014-08-24 NOTE — Progress Notes (Addendum)
  Progress Note    08/24/2014 7:23 AM Hospital Day 12  Subjective:  No complaints-says he has lots of gas-denies BM; walked in hallways 4 times   Afebrile 90's-100's systolic 70's NSR 95% RA  Filed Vitals:   08/24/14 0439  BP: 104/65  Pulse: 77  Temp: 98.4 F (36.9 C)  Resp: 18    Physical Exam: Lungs:  Non labored Abdomen:  Soft, NT/ND; distant BS throughout; midline incision is healing nicely Extremities:  2+ PT pulses bilaterally  CBC    Component Value Date/Time   WBC 6.7 08/20/2014 1443   RBC 3.59* 08/20/2014 1443   HGB 10.5* 08/20/2014 1443   HCT 31.4* 08/20/2014 1443   PLT 383 08/20/2014 1443   MCV 87.5 08/20/2014 1443   MCH 29.2 08/20/2014 1443   MCHC 33.4 08/20/2014 1443   RDW 13.7 08/20/2014 1443   LYMPHSABS 0.9 08/18/2014 0930   MONOABS 0.7 08/18/2014 0930   EOSABS 0.0 08/18/2014 0930   BASOSABS 0.0 08/18/2014 0930    BMET    Component Value Date/Time   NA 135 08/21/2014 0418   K 4.5 08/21/2014 0418   CL 105 08/21/2014 0418   CO2 26 08/21/2014 0418   GLUCOSE 119* 08/21/2014 0418   BUN 13 08/21/2014 0418   CREATININE 0.94 08/21/2014 0418   CALCIUM 8.9 08/21/2014 0418   GFRNONAA 79* 08/21/2014 0418   GFRAA >90 08/21/2014 0418    INR    Component Value Date/Time   INR 1.40 08/05/2014 1224     Intake/Output Summary (Last 24 hours) at 08/24/14 0723 Last data filed at 08/24/14 0440  Gross per 24 hour  Intake   1455 ml  Output   1200 ml  Net    255 ml     Assessment/Plan:  79 y.o. male was admitted with post op ileus s/p AAA repair Hospital Day 12  -pt has tolerated his clear liquid diet and denies vomiting.  He has not had a bowel movement -advance diet per GI -reglan and protonix changed to po yesterday -continue mobilizing-walked 4x in hallways yesterday   Doreatha MassedSamantha Rhyne, PA-C [facility] [phone] 08/24/2014 7:23 AM  Agree with above Abd soft and non tender Good appetite Wants mp Ore to  eat  Advance diet today am And  plan dc home in am

## 2014-08-24 NOTE — Progress Notes (Signed)
Medicare Important Message given? YES  (If response is "NO", the following Medicare IM given date fields will be blank)  Date Medicare IM given: 08/24/14 Medicare IM given by:  Anedra Penafiel  

## 2014-08-25 ENCOUNTER — Encounter: Payer: Medicare HMO | Admitting: Vascular Surgery

## 2014-08-25 ENCOUNTER — Telehealth: Payer: Self-pay | Admitting: Vascular Surgery

## 2014-08-25 LAB — GLUCOSE, CAPILLARY
GLUCOSE-CAPILLARY: 113 mg/dL — AB (ref 70–99)
GLUCOSE-CAPILLARY: 96 mg/dL (ref 70–99)
GLUCOSE-CAPILLARY: 99 mg/dL (ref 70–99)
Glucose-Capillary: 105 mg/dL — ABNORMAL HIGH (ref 70–99)

## 2014-08-25 MED ORDER — PANTOPRAZOLE SODIUM 40 MG PO TBEC: 40.0000 mg | DELAYED_RELEASE_TABLET | Freq: Every day | ORAL | Status: AC

## 2014-08-25 MED ORDER — METOCLOPRAMIDE HCL 10 MG PO TABS: 10.0000 mg | ORAL_TABLET | Freq: Two times a day (BID) | ORAL | Status: AC

## 2014-08-25 NOTE — Progress Notes (Signed)
PA Samantha paged, care management request to resume hh PT. Darrel HooverWilson,Grabiela Wohlford S 10:38 AM

## 2014-08-25 NOTE — Discharge Summary (Signed)
Discharge Summary    Pauline GoodHui Muk Evett December 26, 1935 [age] y.o. male  [id number]  Admission Date: 08/12/2014  Discharge Date: 08/25/14  Physician: Pryor OchoaJames D Lawson, MD  Admission Diagnosis: Abdominal pain [R10.9]   HPI:   This is a 79 y.o. male who presents for evaluation of nausea and vomiting post AAA repair. Pt had Aorto to right external and left common iliac by Dr [name] 1 week ago. He began to have nausea and vomiting today with no bowel movement. Denies abdominal pain. Has been able to urinate. No dysuria. History obtained mainly from family pt speaks mainly [name]. Other medical problems include diabetes and elevated cholesterol which are stable.  Hospital Course:  The patient was admitted to the hospital and NGT was placed.  The pt continued to have high volume NGT output.    The NGT was removed on HD 4 after it had been clamped.  He was advanced to clear liquids and was doing well without nausea.  Abdomen continued to be soft.  By HD 6, he started vomiting again with clear and full liquid diet.  He had 1 BM postoperatively and was having flatus, but not on a regular basis.  He underwent a CTA, which revealed no evidence of obstructions or problems with the aortic graft.  His electrolytes continued to look okay.    A general surgery consult was obtained on HD 6.  The NGT was placed again.  General surgery felt he likely had a post operative ileus, but could not rule out obstruction.  General surgery proceeded with small bowel protocol as follows: [facility] 2. Obtain 90cc of Gastrografin 3. At 2 hours, give gastrograffin and CLAMP NGT for 1 hour. 4. After it has been clamped for 1 hour, place back to LWIS 5. Obtain abdominal XR 8 hours after gastrograffin has been given.  A GI consult was obtained.  He was set up for EGD.  Clotrimazole troches were added to his regimen for what was suspected to be oral candidiasis.   The upper endoscopy allowed the scope  into the proximal jejunum without visualizing any obstruction, just inflammation.  General surgery exploration on hold with plans to remove NGT and start liquids.  Endoscopic impression from 08/20/14 as follows: The NG tube is causing minor esophagitis. There was moderate pan-gastritis, this was biopsied distally. The visualized small bowel was all normal. I feel I was able to pass the scope into the proximal jejunum and all appeared normal.  GI recommendations were as follows: Will start reglan 10mg  IV q 6 hours. If still very elevated NG output, then should proceed with UGI/SBFT (perhaps there is anatomic narrowing more distal that was visualized by upper endoscopy today  NGT was removed on 08/21/14 and he was started on clear liquids.  By the next day, he was advanced to full liquids and tolerated well without vomiting. He tolerated well, but did vomit once on HD 10.   Full liquids were continued another day.  On HD 12, his diet was advanced to soft diet.  He did tolerate this well.  He had a large BM and was feeling much better.  He was discharged home on HD 13.   (Pt's staples were removed on HD 7).   CBC    Component Value Date/Time   WBC 6.7 08/20/2014 1443   RBC 3.59* 08/20/2014 1443   HGB 10.5* 08/20/2014 1443   HCT 31.4* 08/20/2014 1443   PLT 383 08/20/2014 1443  MCV 87.5 08/20/2014 1443   MCH 29.2 08/20/2014 1443   MCHC 33.4 08/20/2014 1443   RDW 13.7 08/20/2014 1443   LYMPHSABS 0.9 08/18/2014 0930   MONOABS 0.7 08/18/2014 0930   EOSABS 0.0 08/18/2014 0930   BASOSABS 0.0 08/18/2014 0930    BMET    Component Value Date/Time   NA 135 08/21/2014 0418   K 4.5 08/21/2014 0418   CL 105 08/21/2014 0418   CO2 26 08/21/2014 0418   GLUCOSE 119* 08/21/2014 0418   BUN 13 08/21/2014 0418   CREATININE 0.94 08/21/2014 0418   CALCIUM 8.9 08/21/2014 0418   GFRNONAA 79* 08/21/2014 0418   GFRAA >90 08/21/2014 0418     Discharge Instructions:   The patient is  discharged with extensive instructions on wound care and progressive ambulation.  They are instructed not to drive or perform any heavy lifting until returning to see the physician in his office.  Discharge Instructions    ABDOMINAL PROCEDURE/ANEURYSM REPAIR/AORTO-BIFEMORAL BYPASS:  Call MD for increased abdominal pain; cramping diarrhea; nausea/vomiting    Complete by:  As directed      Call MD for:  redness, tenderness, or signs of infection (pain, swelling, bleeding, redness, odor or green/yellow discharge around incision site)    Complete by:  As directed      Call MD for:  severe or increased pain, loss or decreased feeling  in affected limb(s)    Complete by:  As directed      Call MD for:  temperature >100.5    Complete by:  As directed      Discharge instructions    Complete by:  As directed   New medication Reglan added to take twice a day for 3 days. New medication Protonix added to take daily.     Discharge wound care:    Complete by:  As directed   Shower daily with soap and water starting today     Driving Restrictions    Complete by:  As directed   May start driving as long as you are not taking pain medication.     Lifting restrictions    Complete by:  As directed   No lifting for 1 week     Resume previous diet    Complete by:  As directed            Discharge Diagnosis:  Abdominal pain [R10.9]  Secondary Diagnosis: Patient Active Problem List   Diagnosis Date Noted  . Postoperative ileus 08/13/2014  . AAA (abdominal aortic aneurysm) 08/05/2014  . Aneurysm, common iliac artery 07/06/2014  . Arterial occlusion, lower extremity 06/25/2014  . AAA (abdominal aortic aneurysm) without rupture 05/26/2014   Past Medical History  Diagnosis Date  . Hyperlipidemia   . Hypertension   . Diabetes mellitus without complication     Type 2  . Constipation   . Arthritis        Medication List    TAKE these medications        aspirin EC 81 MG tablet  Take 1  tablet (81 mg total) by mouth daily.     fenofibrate 145 MG tablet  Commonly known as:  TRICOR  Take 145 mg by mouth daily.     lisinopril 5 MG tablet  Commonly known as:  PRINIVIL,ZESTRIL  Take 5 mg by mouth daily.     metFORMIN 500 MG 24 hr tablet  Commonly known as:  GLUCOPHAGE-XR  Take 1 tablet (500 mg total) by mouth daily. Do  not restart this medication until Sunday 07/05/14.     metoCLOPramide 10 MG tablet  Commonly known as:  REGLAN  Take 1 tablet (10 mg total) by mouth 2 (two) times daily after a meal.     omega-3 acid ethyl esters 1 G capsule  Commonly known as:  LOVAZA  Take 1 g by mouth 2 (two) times daily.     pantoprazole 40 MG tablet  Commonly known as:  PROTONIX  Take 1 tablet (40 mg total) by mouth daily at 6 (six) AM.     pravastatin 20 MG tablet  Commonly known as:  PRAVACHOL  Take 20 mg by mouth daily.        Prescriptions given: Reglan  bid x 3 days #6 NR Protonix  daily #30 3RF  Disposition: home  Patient's condition: is Good  Follow up: 1. Dr. Hart Rochester in 4 weeks   Doreatha Massed, PA-C [facility] [phone] 08/25/2014  9:33 AM

## 2014-08-25 NOTE — Telephone Encounter (Addendum)
-----   Message from Sharee PimpleMarilyn K McChesney, RN sent at 08/25/2014  1:41 PM EST ----- Regarding: Schedule   ----- Message -----    From: Dara LordsSamantha J Rhyne, PA-C    Sent: 08/25/2014   9:46 AM      To: Vvs Charge Pool  S/p readmit for post op ileus.  F/u with Dr. [name] in 4 weeks.  Thanks, Samantha  08/25/14: lm for pt re appt, dpm

## 2014-08-25 NOTE — Care Management Note (Signed)
    Page 1 of 1   08/25/2014     11:55:52 AM CARE MANAGEMENT NOTE 08/25/2014  Patient:  Frederick [name],Frederick [name]   Account Number:  [id number]  Date Initiated:  08/17/2014  Documentation initiated by:  Donn PieriniWEBSTER,Clarie Camey  Subjective/Objective Assessment:   Pt admitted with post op ileus     Action/Plan:   PTA pt lived at home with son- [facility]-PT will need resumption orders on discharge   Anticipated DC Date:  08/25/2014   Anticipated DC Plan:  HOME W HOME HEALTH SERVICES      DC Planning Services  CM consult      [facility]  Resumption Of Svcs/PTA Provider   Choice offered to / List presented to:          [facility]  [facility]      [facility]  [facility]   Status of service:  Completed, signed off Medicare Important Message given?  YES (If response is "NO", the following Medicare IM given date fields will be blank) Date Medicare IM given:  08/20/2014 Medicare IM given by:  Donn PieriniWEBSTER,Summar Mcglothlin Date Additional Medicare IM given:  08/24/2014 Additional Medicare IM given by:  Donn PieriniKRISTI Aleicia Kenagy  [facility]  Per UR Regulation:  Reviewed for med. necessity/level of care/duration of stay  If discussed at Long Length of Stay Meetings, dates discussed:   08/18/2014  08/20/2014  08/25/2014    Comments:  08/25/14- 1100- Donn PieriniKristi Veron Senner RN, BSN [phone] Pt for d/c home today, resumption order placed for HH-PT- [facility] with Florentina Add[facility]Katie [facility] and d/c for today. HH will resume with [facility].

## 2014-08-25 NOTE — Progress Notes (Signed)
Patient ID: Frederick [name], male   DOB: 08/21/1935, 79 y.o.   MRN: [id number]     [facility]      [address] [place]   [place], [place] [address]    Phone: [phone] FAX: [phone]     Subjective: Pt had multiple questions, spoke via telephone interpreter. Had a large BM.  Tolerating solids.  Denies n/v/abd pain.   Objective:  Vital signs:  Filed Vitals:   08/24/14 0439 08/24/14 1440 08/24/14 2034 08/25/14 0422  BP: 104/65 108/69 111/66 115/71  Pulse: 77 88 85 80  Temp: 98.4 F (36.9 C) 98.8 F (37.1 C) 97.9 F (36.6 C) 98.5 F (36.9 C)  TempSrc: Oral Oral Oral Oral  Resp: Height:      Weight:      SpO2: 95% 98% 96% 96%    Last BM Date: 08/24/14  Intake/Output   Yesterday:  02/01 0701 - 02/02 0700 In: 1265 [P.O.:720; I.V.:545] Out: 725 [Urine:725] This shift: I/O last 3 completed shifts: In: 1265 [P.O.:720; I.V.:545] Out: 1725 [Urine:1725]     Physical Exam: General: Pt awake/alert/oriented x4 in no acute distress Abdomen: Soft.  Nondistended.  Healing midline incision.  No evidence of peritonitis.  No incarcerated hernias.    Problem List:   Active Problems:   Postoperative ileus    Results:   Labs: Results for orders placed or performed during the hospital encounter of 08/12/14 (from the past 48 hour(s))  Glucose, capillary     Status: Abnormal   Collection Time: 08/23/14 12:09 PM  Result Value Ref Range   Glucose-Capillary 111 (H) 70 - 99 mg/dL  Glucose, capillary     Status: None   Collection Time: 08/23/14  4:10 PM  Result Value Ref Range   Glucose-Capillary 99 70 - 99 mg/dL  Glucose, capillary     Status: Abnormal   Collection Time: 08/23/14  8:06 PM  Result Value Ref Range   Glucose-Capillary 113 (H) 70 - 99 mg/dL  Glucose, capillary     Status: Abnormal   Collection Time: 08/23/14 11:53 PM  Result Value Ref Range   Glucose-Capillary 102 (H) 70 - 99 mg/dL  Glucose, capillary      Status: Abnormal   Collection Time: 08/24/14  4:35 AM  Result Value Ref Range   Glucose-Capillary 103 (H) 70 - 99 mg/dL  Glucose, capillary     Status: Abnormal   Collection Time: 08/24/14  8:43 AM  Result Value Ref Range   Glucose-Capillary 138 (H) 70 - 99 mg/dL   Comment 1 Notify RN    Comment 2 Documented in Chart   Glucose, capillary     Status: Abnormal   Collection Time: 08/24/14 11:11 AM  Result Value Ref Range   Glucose-Capillary 122 (H) 70 - 99 mg/dL   Comment 1 Notify RN    Comment 2 Documented in Chart   Glucose, capillary     Status: Abnormal   Collection Time: 08/24/14  4:15 PM  Result Value Ref Range   Glucose-Capillary 108 (H) 70 - 99 mg/dL   Comment 1 Notify RN    Comment 2 Documented in Chart   Glucose, capillary     Status: Abnormal   Collection Time: 08/24/14  8:19 PM  Result Value Ref Range   Glucose-Capillary 124 (H) 70 - 99 mg/dL  Glucose, capillary     Status: None   Collection Time: 08/25/14 12:37 AM  Result Value Ref Range  Glucose-Capillary 96 70 - 99 mg/dL  Glucose, capillary     Status: Abnormal   Collection Time: 08/25/14  4:11 AM  Result Value Ref Range   Glucose-Capillary 113 (H) 70 - 99 mg/dL  Glucose, capillary     Status: Abnormal   Collection Time: 08/25/14  7:37 AM  Result Value Ref Range   Glucose-Capillary 105 (H) 70 - 99 mg/dL    Imaging / Studies: No results found.  Medications / Allergies:  Scheduled Meds: . clotrimazole  10 mg Oral 5 X Daily  . diatrizoate meglumine-sodium  90 mL Oral Once  . enoxaparin (LOVENOX) injection  40 mg Subcutaneous Q24H  . insulin aspart  0-9 Units Subcutaneous 6 times per day  . metoCLOPramide  10 mg Oral TID AC  . ondansetron  4 mg Intravenous BID  . pantoprazole  40 mg Oral Q0600   Continuous Infusions: . dextrose 5 % and 0.45 % NaCl with KCl 20 mEq/L 50 mL/hr at 08/24/14 1806   PRN Meds:.acetaminophen **OR** acetaminophen, bisacodyl, hydrALAZINE, labetalol, magnesium hydroxide,  metoprolol, morphine injection, phenol, polyvinyl alcohol, promethazine, promethazine  Antibiotics: Anti-infectives    None        Assessment/Plan Nausea and vomiting Post op ileus -resolved -tolerating solids -stable for DC from surgical standpoint.   Ashok NorrisEmina Kieanna Rollo, [facility] LLCNP-BC [facility] Pager [phone](7A-4:30P) For consults and floor pages call [phone](7A-4:30P)  08/25/2014 9:15 AM

## 2014-08-25 NOTE — Progress Notes (Signed)
Frederick [name],[name]   Pt discharged home with son, hh PT ordered Discharge instructions given & reviewed Education discussed  IV dc'd  Tele dc'd  Pt discharged via wheelchair with volunteer services All patient belongs at side.    Frederick [name],[name] 2:16 PM

## 2014-08-25 NOTE — Progress Notes (Signed)
Patient ID: Frederick [name], male   DOB: 1936/06/27, 79 y.o.   MRN: [id number] Vascular Surgery Progress Note  Subjective: Patient tolerated soft diet last night and this morning. No further vomiting. Had been bowel movement yesterday and states he feels much better. Ready to go home.  Objective:  Filed Vitals:   08/25/14 0422  BP: 115/71  Pulse: 80  Temp: 98.5 F (36.9 C)  Resp: 18    Abdomen soft nontender with nicely healing midline incision 3+ femoral and distal pulses palpable   Labs:  Recent Labs Lab 08/19/14 0530 08/20/14 0650 08/21/14 0418  CREATININE 1.37* 1.15 0.94    Recent Labs Lab 08/19/14 0530 08/20/14 0650 08/21/14 0418  NA 132* 132* 135  K 4.1 4.7 4.5  CL 98 102 105  CO2 26 23 26   BUN 30* 23 13  CREATININE 1.37* 1.15 0.94  GLUCOSE 169* 136* 119*  CALCIUM 9.5 8.9 8.9    Recent Labs Lab 08/18/14 0930 08/20/14 1443  WBC 10.0 6.7  HGB 10.7* 10.5*  HCT 31.7* 31.4*  PLT 403* 383   No results for input(s): INR in the last 168 hours.  I/O last 3 completed shifts: In: 1265 [P.O.:720; I.V.:545] Out: 1725 [Urine:1725]  Imaging: No results found.  Assessment/Plan:   LOS: 13 days  s/p Procedure(s): ESOPHAGOGASTRODUODENOSCOPY (EGD)  Patient currently doing well with apparent resolution of extended postoperative ileus. No evidence of obstruction ever found. Her graft tolerating diet and plan DC home later today when son can pick him up and return to office in 4 weeks   Josephina GipJames Garlan Drewes, MD 08/25/2014 9:07 AM

## 2014-09-02 ENCOUNTER — Telehealth: Payer: Self-pay

## 2014-09-02 NOTE — Telephone Encounter (Signed)
Phone call from pt's. Physical Therapist.  Reported that the pt. and his son are declining Physical therapy.  The Physical therapist reported that the son advised him the patient was "walking well, without difficulty."  Will make Dr. [name] aware.

## 2014-09-21 ENCOUNTER — Encounter: Payer: Self-pay | Admitting: Vascular Surgery

## 2014-09-22 ENCOUNTER — Ambulatory Visit (INDEPENDENT_AMBULATORY_CARE_PROVIDER_SITE_OTHER): Payer: Self-pay | Admitting: Vascular Surgery

## 2014-09-22 ENCOUNTER — Encounter: Payer: Self-pay | Admitting: Vascular Surgery

## 2014-09-22 VITALS — BP 117/66 | HR 70 | Wt 142.2 lb

## 2014-09-22 DIAGNOSIS — I714 Abdominal aortic aneurysm, without rupture, unspecified: Secondary | ICD-10-CM

## 2014-09-22 DIAGNOSIS — I723 Aneurysm of iliac artery: Secondary | ICD-10-CM | POA: Insufficient documentation

## 2014-09-22 NOTE — Progress Notes (Signed)
Subjective:     Patient ID: Frederick [name], male   DOB: [birth year], 79 y.o.   MRN: [id number]  HPI this 79 year old male returns 6 weeks post open resection and grafting of abdominal aortic and right common iliac artery aneurysm. This was complicated by a readmission postoperatively for a prolonged ileus. He had gastroduodenoscopy performed by Dr. Wendall Papaan Jacobs with no abnormalities found. He has done well since his discharge from the hospital. He does complain of some mild right buttock claudication after ambulating 5 minutes. This is relieved by rest. He has no calf or thigh claudication and is able to eat a normal meal. He has lost 8 pounds since his preoperative weight. His bowel habits are normal and he is having no nausea and vomiting.  Past Medical History  Diagnosis Date  . Hyperlipidemia   . Hypertension   . Diabetes mellitus without complication     Type 2  . Constipation   . Arthritis     History  Substance Use Topics  . Smoking status: Former Smoker -- 28 years    Quit date: 05/26/2004  . Smokeless tobacco: Not on file  . Alcohol Use: No    Family History  Problem Relation Age of Onset  . Family history unknown: Yes    No Known Allergies   Current outpatient prescriptions:  .  aspirin EC 81 MG tablet, Take 1 tablet (81 mg total) by mouth daily., Disp: 30 tablet, Rfl: 0 .  fenofibrate (TRICOR) 145 MG tablet, Take 145 mg by mouth daily. , Disp: , Rfl:  .  lisinopril (PRINIVIL,ZESTRIL) 5 MG tablet, Take 5 mg by mouth daily. , Disp: , Rfl:  .  metFORMIN (GLUCOPHAGE-XR) 500 MG 24 hr tablet, Take 1 tablet (500 mg total) by mouth daily. Do not restart this medication until Sunday 07/05/14., Disp: 30 tablet, Rfl: 0 .  omega-3 acid ethyl esters (LOVAZA) 1 G capsule, Take 1 g by mouth 2 (two) times daily. , Disp: , Rfl:  .  pantoprazole (PROTONIX) 40 MG tablet, Take 1 tablet (40 mg total) by mouth daily at 6 (six) AM., Disp: 30 tablet, Rfl: 3 .  pravastatin (PRAVACHOL) 20 MG tablet,  Take 20 mg by mouth daily. , Disp: , Rfl:  .  metoCLOPramide (REGLAN) 10 MG tablet, Take 1 tablet (10 mg total) by mouth 2 (two) times daily after a meal., Disp: 6 tablet, Rfl: 0  BP 117/66 mmHg  Pulse 70  Wt 142 lb 3.2 oz (64.501 kg)  SpO2 100%  Body mass index is 20.4 kg/(m^2).           Review of Systems denies chest pain, dyspnea on exertion, hemoptysis.     Objective:   Physical Exam BP 117/66 mmHg  Pulse 70  Wt 142 lb 3.2 oz (64.501 kg)  SpO2 100%  Gen. well-developed well-nourished male no apparent stress alert and oriented 3 Lungs no rhonchi or wheezing Abdomen soft nontender with well-healed midline incision no evidence of ventral hernia 3+ femoral and dorsalis pedis pulses palpable bilaterally       Assessment:     6 weeks post open resection and grafting of abdominal aortic aneurysm and right common iliac artery aneurysm which necessitated ligation of right internal iliac artery Right buttock claudication secondary to ligation right internal iliac artery Patient has widely patent left internal iliac artery with good cross-filling and should develop collateral flow to right buttock area with time Postop ileus has resolved-normal GI function currently    Plan:  Return in 3 months with repeat ABIs Continue to increase activity as tolerated

## 2014-09-23 NOTE — Addendum Note (Signed)
Addended by: Alinda Egolf K on: 09/23/2014 11:03 AM   Modules accepted: Orders  

## 2015-01-01 ENCOUNTER — Encounter: Payer: Self-pay | Admitting: Vascular Surgery

## 2015-01-05 ENCOUNTER — Ambulatory Visit (INDEPENDENT_AMBULATORY_CARE_PROVIDER_SITE_OTHER): Payer: Medicare HMO | Admitting: Vascular Surgery

## 2015-01-05 ENCOUNTER — Encounter: Payer: Self-pay | Admitting: Vascular Surgery

## 2015-01-05 ENCOUNTER — Ambulatory Visit (HOSPITAL_COMMUNITY)
Admission: RE | Admit: 2015-01-05 | Discharge: 2015-01-05 | Disposition: A | Discharge status: Home / Self Care | Payer: Medicare HMO | Source: Ambulatory Visit | Attending: Vascular Surgery | Admitting: Vascular Surgery

## 2015-01-05 VITALS — BP 131/80 | HR 69 | Resp 16 | Ht 68.11 in | Wt 145.0 lb

## 2015-01-05 DIAGNOSIS — I714 Abdominal aortic aneurysm, without rupture, unspecified: Secondary | ICD-10-CM

## 2015-01-05 DIAGNOSIS — I10 Essential (primary) hypertension: Secondary | ICD-10-CM | POA: Diagnosis not present

## 2015-01-05 DIAGNOSIS — M79651 Pain in right thigh: Secondary | ICD-10-CM | POA: Insufficient documentation

## 2015-01-05 DIAGNOSIS — Z48812 Encounter for surgical aftercare following surgery on the circulatory system: Secondary | ICD-10-CM

## 2015-01-05 DIAGNOSIS — E785 Hyperlipidemia, unspecified: Secondary | ICD-10-CM | POA: Diagnosis not present

## 2015-01-05 DIAGNOSIS — I723 Aneurysm of iliac artery: Secondary | ICD-10-CM

## 2015-01-05 DIAGNOSIS — E119 Type 2 diabetes mellitus without complications: Secondary | ICD-10-CM | POA: Diagnosis not present

## 2015-01-05 NOTE — Progress Notes (Signed)
Subjective:     Patient ID: Frederick [name], male   DOB: January 28, 1936, 79 y.o.   MRN: [id number]  HPI this 79 year old [place] male returns 5 months post-open resection and grafting of abdominal aortic aneurysm with ligation right internal iliac artery. He has some mild right buttock claudication. He denies any calf claudication. He does develop some weakness in the right thigh after ambulating for 5 or 10 minutes. His appetite is good. He's had no nausea and vomiting. His weight is about 5 or 6 pounds less than his preoperative weight. He has no specific complaints. He is taking 181 mg aspirin tablet per day.  Past Medical History  Diagnosis Date  . Hyperlipidemia   . Hypertension   . Diabetes mellitus without complication     Type 2  . Constipation   . Arthritis   . AAA (abdominal aortic aneurysm)     History  Substance Use Topics  . Smoking status: Former Smoker -- 28 years    Quit date: 05/26/2004  . Smokeless tobacco: Not on file  . Alcohol Use: No    Family History  Problem Relation Age of Onset  . Family history unknown: Yes    No Known Allergies   Current outpatient prescriptions:  .  lisinopril (PRINIVIL,ZESTRIL) 5 MG tablet, Take 5 mg by mouth daily. , Disp: , Rfl:  .  metFORMIN (GLUCOPHAGE-XR) 500 MG 24 hr tablet, Take 1 tablet (500 mg total) by mouth daily. Do not restart this medication until Sunday 07/05/14., Disp: 30 tablet, Rfl: 0 .  omega-3 acid ethyl esters (LOVAZA) 1 G capsule, Take 1 g by mouth 2 (two) times daily. , Disp: , Rfl:  .  pravastatin (PRAVACHOL) 20 MG tablet, Take 20 mg by mouth daily. , Disp: , Rfl:  .  aspirin EC 81 MG tablet, Take 1 tablet (81 mg total) by mouth daily. (Patient not taking: Reported on 01/05/2015), Disp: 30 tablet, Rfl: 0 .  fenofibrate (TRICOR) 145 MG tablet, Take 145 mg by mouth daily. , Disp: , Rfl:  .  metoCLOPramide (REGLAN) 10 MG tablet, Take 1 tablet (10 mg total) by mouth 2 (two) times daily after a meal., Disp: 6 tablet, Rfl:  0 .  pantoprazole (PROTONIX) 40 MG tablet, Take 1 tablet (40 mg total) by mouth daily at 6 (six) AM., Disp: 30 tablet, Rfl: 3  Filed Vitals:   01/05/15 1046  BP: 131/80  Pulse: 69  Resp: 16  Height: 5' 8.11" (1.73 m)  Weight: 145 lb (65.772 kg)    Body mass index is 21.98 kg/(m^2).           Review of Systems denies chest pain, dyspnea on exertion, PND, orthopnea, hemoptysis.     Objective:   Physical Exam BP 131/80 mmHg  Pulse 69  Resp 16  Ht 5' 8.11" (1.73 m)  Wt 145 lb (65.772 kg)  BMI 21.98 kg/m2  Gen. well-developed well-nourished male in no apparent distress alert and oriented 3 Lungs no rhonchi or wheezing Cardiovascular regular rhythm no murmurs Abdomen soft with well-healed midline incision and no evidence of ventral hernia 3+ femoral and dorsalis pedis pulses palpable bilaterally  Today I ordered ABIs which I reviewed and interpreted. He has triphasic flow in both feet with ABIs exceeding 1.0 bilaterally     Assessment:     Doing well 5 months post-open resection and grafting of abdominal aortic aneurysm with normal ABIs Mild right buttock claudication due to ligation right internal iliac artery  Plan:     Return in 6 months for ABIs and see nurse practitioner This was all communicated to patient through an interpreter today   continue aspirin 81 mg daily

## 2015-01-05 NOTE — Addendum Note (Signed)
Addended by: Adria Dill L on: 01/05/2015 04:17 PM   Modules accepted: Orders

## 2015-04-14 IMAGING — CR DG ABD PORTABLE 1V
1 series · 1 of 1 positions shown · non-contrast
Comparison: CT abdomen and pelvis June 29, 2014

CLINICAL DATA: Perioperative for abdominal aortic aneurysm.

EXAM:
PORTABLE ABDOMEN - 1 VIEW

[AP]
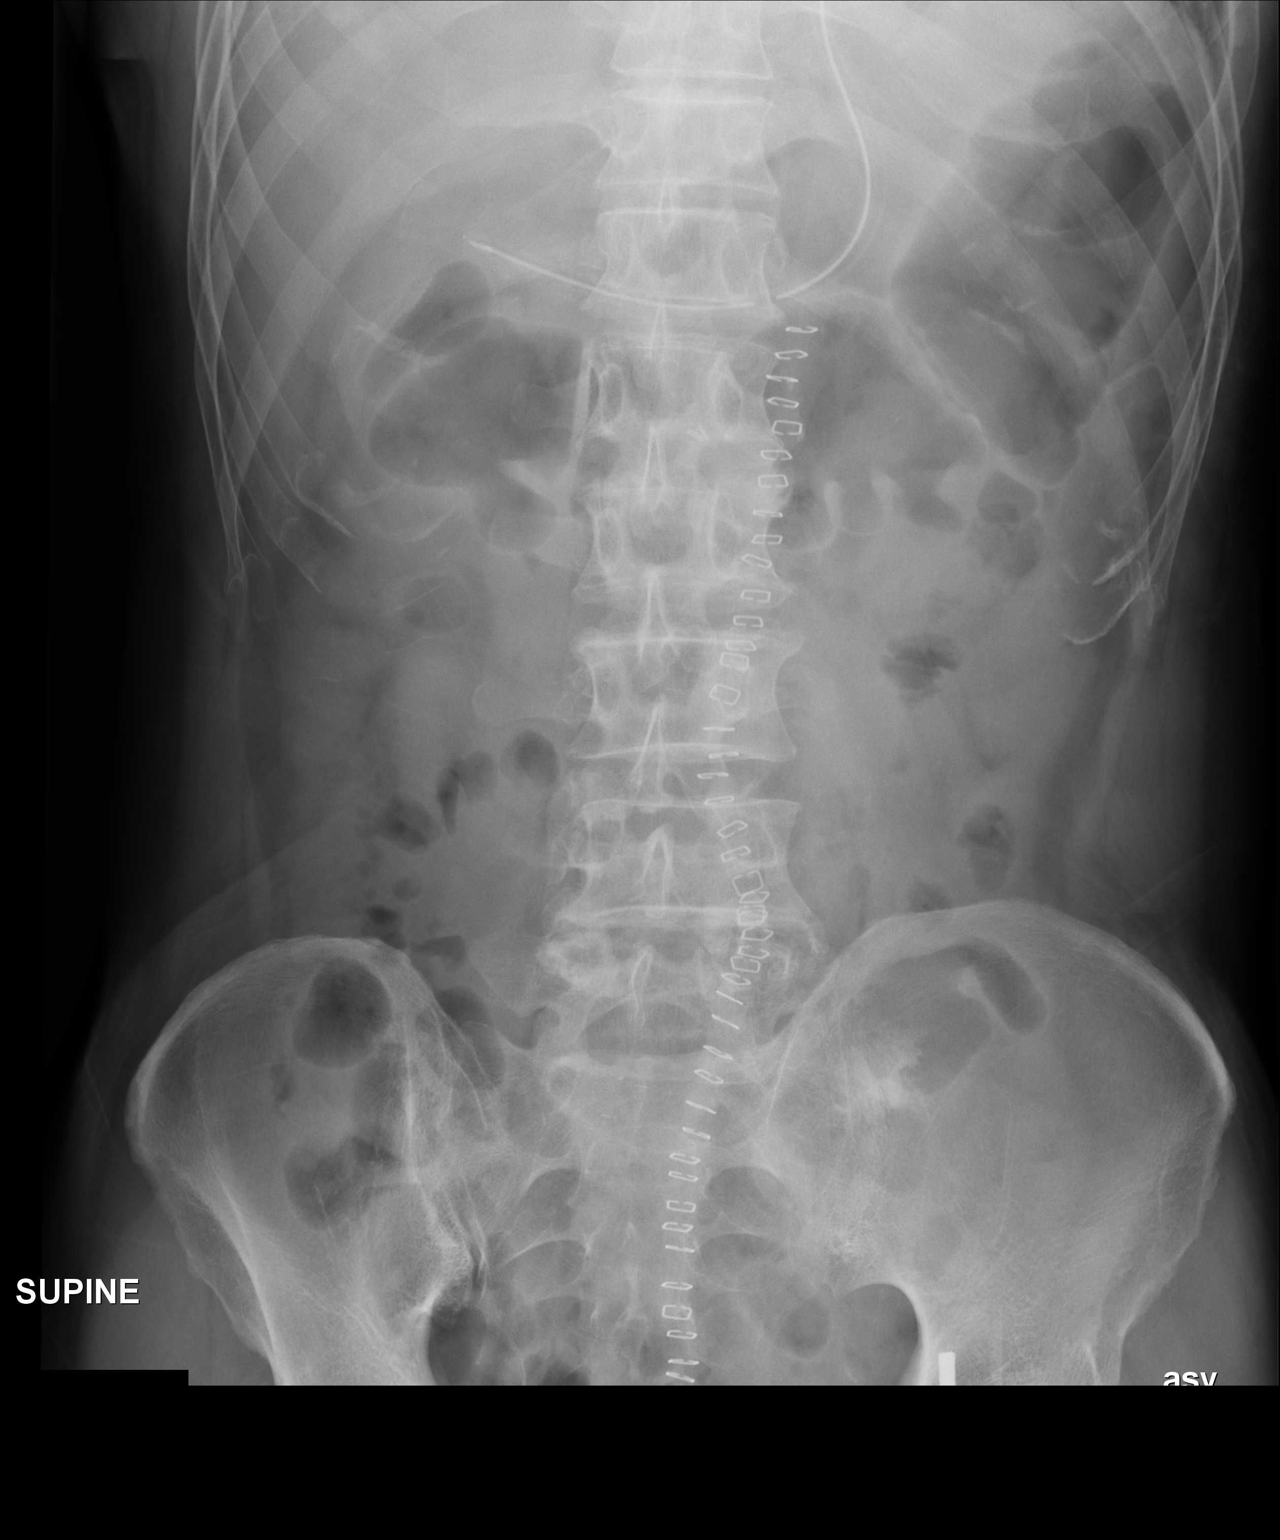

[1 of 1 positions shown; findings below may reference images not displayed]

FINDINGS: The bowel gas pattern is normal. Nasogastric tube is identified with
distal tip in the distal stomach. Midline skin staples are
identified in the abdomen and pelvis. No other radio-opaque calculi
or other significant radiographic abnormality are seen.
IMPRESSION: Midline skin staples are identified in the abdomen and pelvis.
Normal bowel gas pattern. Nasogastric tube is identified with distal
tip in the distal stomach.

## 2015-04-15 IMAGING — CR DG CHEST 1V PORT
1 series · 1 of 1 positions shown · non-contrast
Comparison: Portable chest x-ray of 08/05/2014

CLINICAL DATA: Surgery for abdominal aortic aneurysm repair,
followup

EXAM:
PORTABLE CHEST - 1 VIEW

[AP]
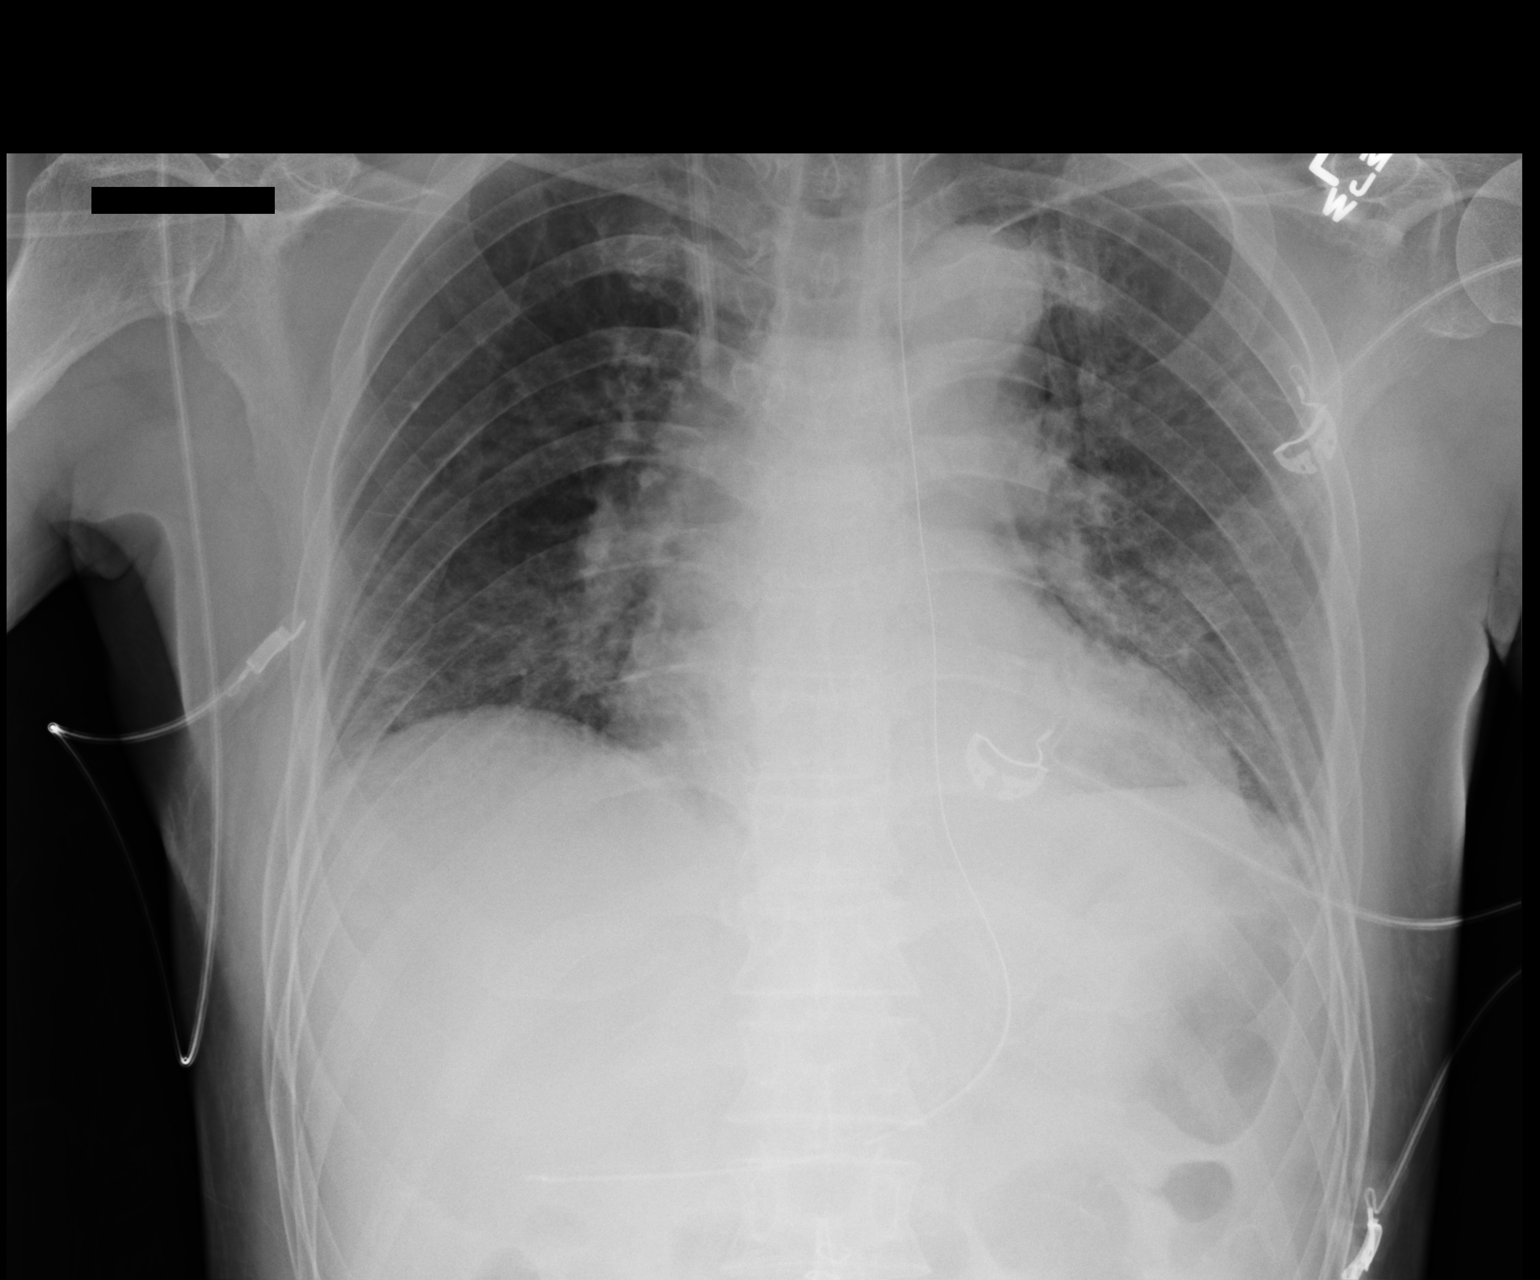

[1 of 1 positions shown; findings below may reference images not displayed]

FINDINGS: The lungs appear better aerated. Mild basilar volume loss remains.
Cardiomegaly is stable. There may be minimal pulmonary vascular
congestion present. Right IJ central venous line tip overlies the
mid SVC.
IMPRESSION: Improved aeration. Mild basilar atelectasis. Possible mild pulmonary
vascular congestion.

## 2015-04-27 IMAGING — CR DG ABD PORTABLE 1V
2 series · 2 of 2 positions shown · non-contrast
Comparison: 08/13/2014

CLINICAL DATA: Small-bowel obstruction, 8 hr film

EXAM:
PORTABLE ABDOMEN - 1 VIEW

[AP (1 of 2)]
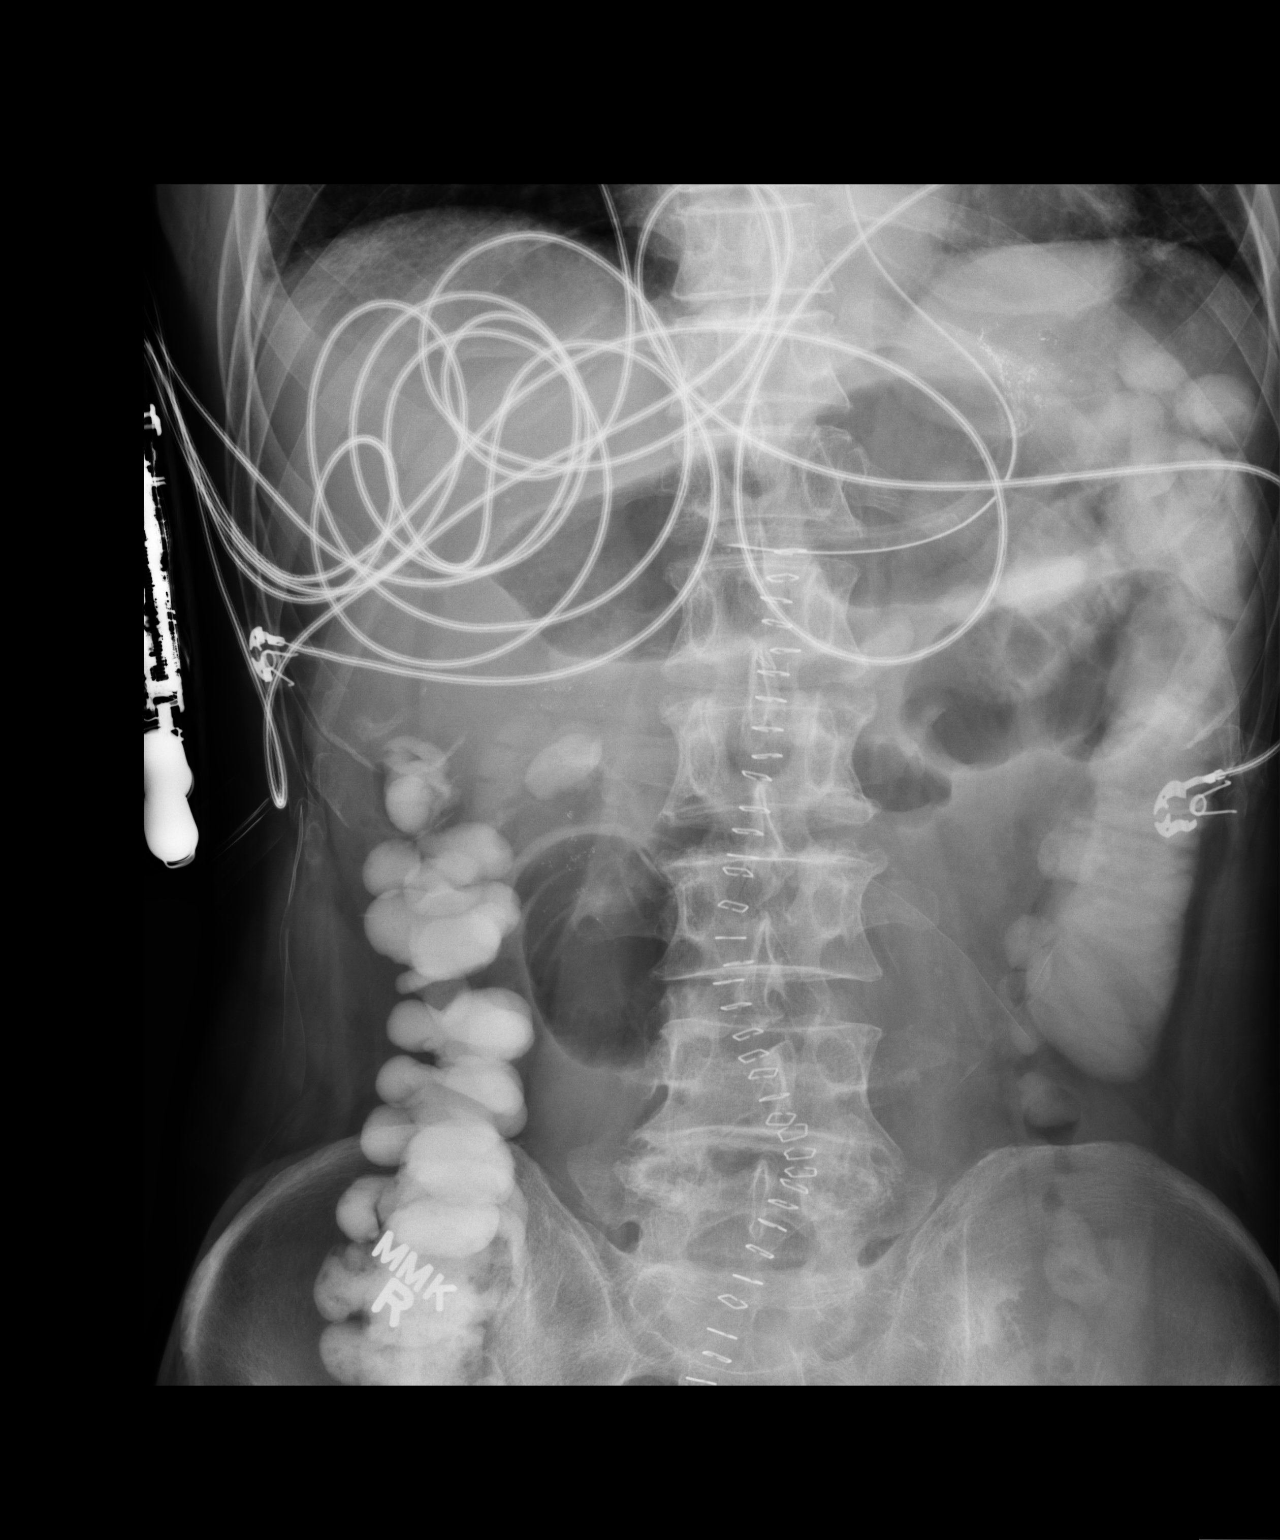

[AP (2 of 2)]
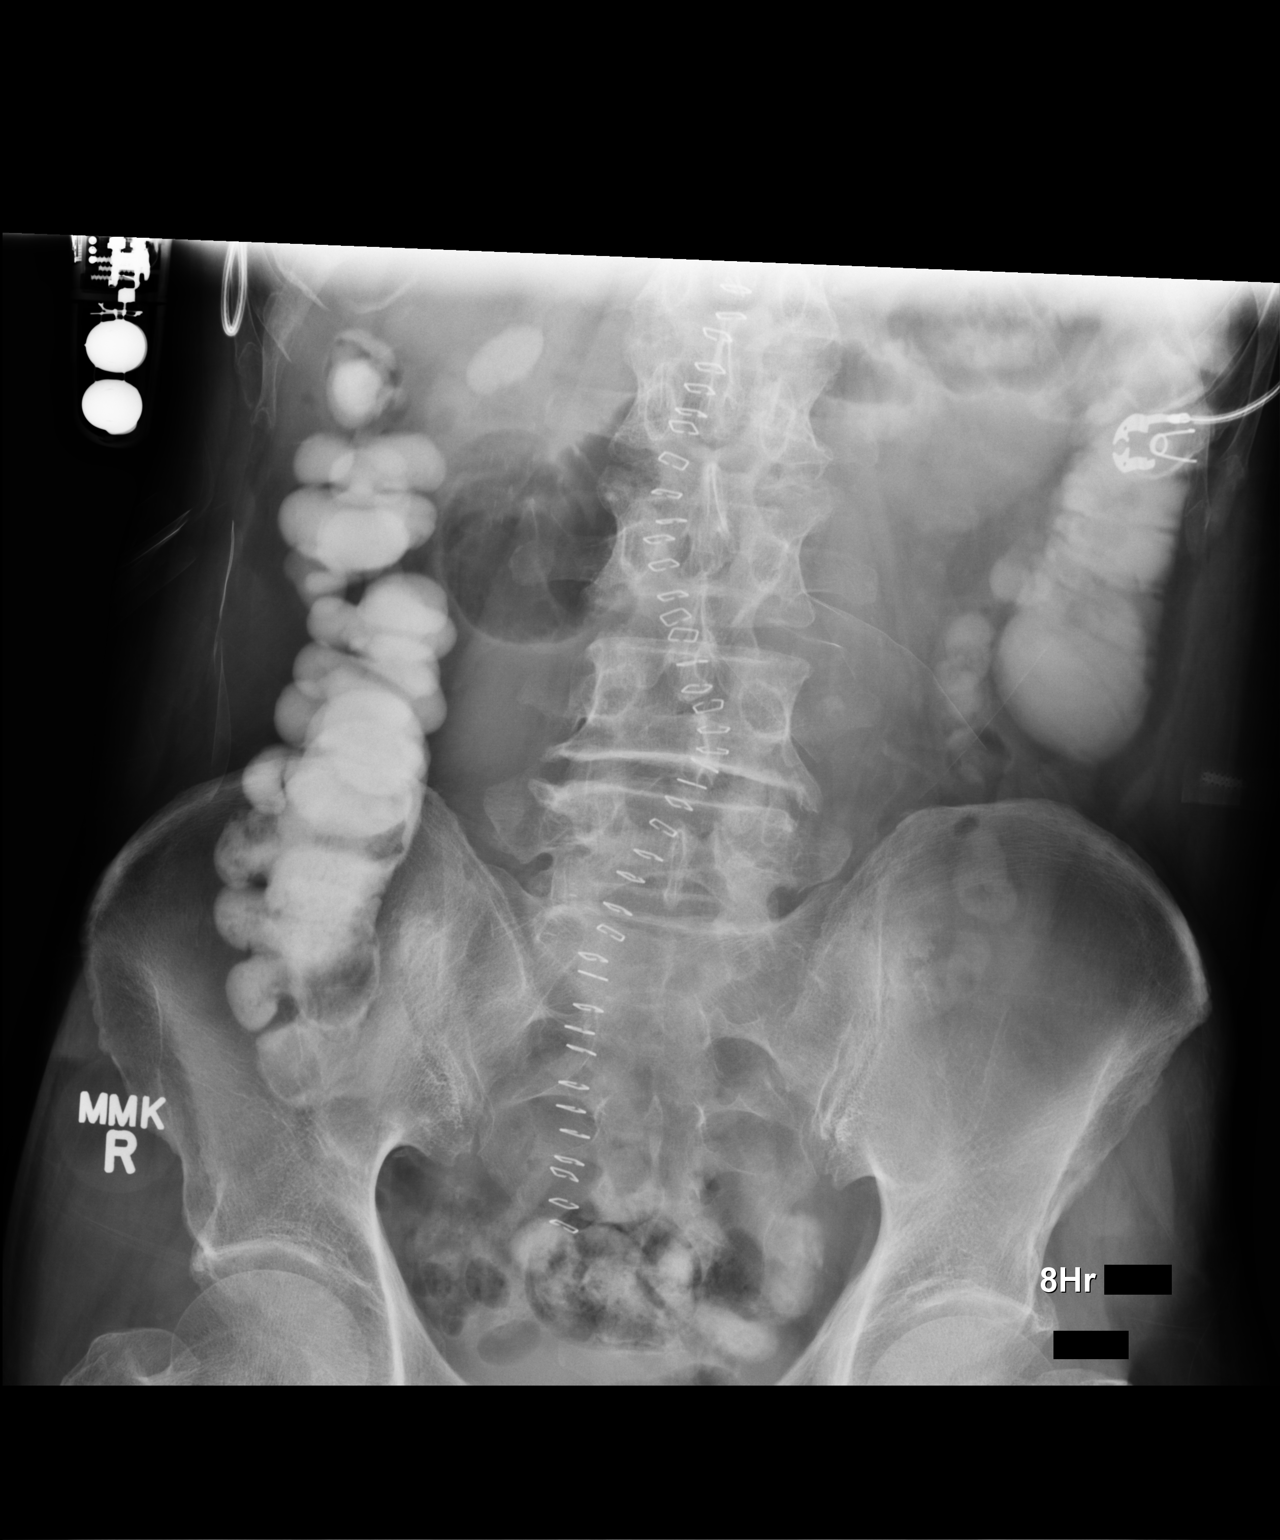

[2 of 2 positions shown; findings below may reference images not displayed]

FINDINGS: Contrast material is noted within small bowel in the left abdomen.
Scattered contrast is seen within the colon although similar in
appearance to that noted on the prior exam. It is difficult to
assess whether this represents just residual contrast within the
right colon or passage of contrast from the recent administration. A
film at 24 hr is recommended.
IMPRESSION: Contrast material within mildly dilated small bowel in left abdomen
as well as within the right colon. This has a similar appearance in
the right colon to that seen on the prior exam prior to the contrast
administration and a follow-up film and 24 hr is recommended to
assess for progress of the contrast from the small bowel in the left
mid abdomen.

## 2015-07-09 ENCOUNTER — Encounter: Payer: Self-pay | Admitting: Family

## 2015-07-14 ENCOUNTER — Ambulatory Visit (HOSPITAL_COMMUNITY)
Admission: RE | Admit: 2015-07-14 | Discharge: 2015-07-14 | Disposition: A | Discharge status: Home / Self Care | Payer: Medicare HMO | Source: Ambulatory Visit | Attending: Family | Admitting: Family

## 2015-07-14 ENCOUNTER — Encounter: Payer: Self-pay | Admitting: Family

## 2015-07-14 ENCOUNTER — Ambulatory Visit (INDEPENDENT_AMBULATORY_CARE_PROVIDER_SITE_OTHER): Payer: Medicare HMO | Admitting: Family

## 2015-07-14 VITALS — BP 133/77 | HR 66 | Ht 68.0 in | Wt 146.0 lb

## 2015-07-14 DIAGNOSIS — Z48812 Encounter for surgical aftercare following surgery on the circulatory system: Secondary | ICD-10-CM | POA: Diagnosis not present

## 2015-07-14 DIAGNOSIS — Z4889 Encounter for other specified surgical aftercare: Secondary | ICD-10-CM | POA: Diagnosis not present

## 2015-07-14 DIAGNOSIS — Z8679 Personal history of other diseases of the circulatory system: Secondary | ICD-10-CM

## 2015-07-14 DIAGNOSIS — I714 Abdominal aortic aneurysm, without rupture, unspecified: Secondary | ICD-10-CM

## 2015-07-14 DIAGNOSIS — I723 Aneurysm of iliac artery: Secondary | ICD-10-CM

## 2015-07-14 DIAGNOSIS — Z9889 Other specified postprocedural states: Secondary | ICD-10-CM

## 2015-07-14 NOTE — Progress Notes (Signed)
[facility] [place]  Established Open AAA Repair   History of Present Illness Frederick [name] is a 79 y.o. male patient of Dr. Hart Rochester who is status post open resection and grafting of abdominal aortic and right common iliac artery aneurysm with ligation right internal iliac artery on 08/05/14.  This was complicated by a readmission postoperatively for a prolonged ileus.  He no longer has right buttock claudication. He denies any other claudication.  His appetite is good. He has regular and normal bowel movements.   He exercises and walks extensively and regularly.  Pt Diabetic: Yes, in good control Pt smoker: former smoker, quit over 25 years ago  Past Medical History  Diagnosis Date  . Hyperlipidemia   . Hypertension   . Diabetes mellitus without complication     Type 2  . Constipation   . Arthritis   . AAA (abdominal aortic aneurysm)     Past Surgical History  Procedure Laterality Date  . Abdominal adhesion surgery      for small bowel adhesions  . Thrombectomy femoral artery Left 06/25/2014    Procedure: Left Femoral Thrombectomy with Bilateral Leg Run Offs;  Surgeon: Fransisco Hertz, MD;  Location: [facility] OR;  Service: Vascular;  Laterality: Left;  . Embolization Right 06/25/2014    Procedure: EMBOLIZATION;  Surgeon: Fransisco Hertz, MD;  Location: [facility] CATH LAB;  Service: Cardiovascular;  Laterality: Right;  . Embolization N/A 07/06/2014    Procedure: EMBOLIZATION;  Surgeon: Fransisco Hertz, MD;  Location: [facility] CATH LAB;  Service: Cardiovascular;  Laterality: N/A;  . Eye surgery      cataracts  . Colonoscopy    . Aortoiliac bypass N/A 08/05/2014    Procedure: RESECTION AND GRAFTING OF ABDOMINAL AORTIC ANEURYSM AND INSERTION OF AORTA TO RIGHT EXTERNAL ILIAC & LEFT COMMON ILIAC WITH 14X 8 MM X40 CM HEMASHIELD GRAFT;  Surgeon: Pryor Ochoa, MD;  Location: [facility] OR;  Service: Vascular;  Laterality: N/A;  . Esophagogastroduodenoscopy N/A 08/20/2014    Procedure:  ESOPHAGOGASTRODUODENOSCOPY (EGD);  Surgeon: Rachael Fee, MD;  Location: [facility] ENDOSCOPY;  Service: Endoscopy;  Laterality: N/A;   Social History Social History   Social History  . Marital Status: Married    Spouse Name: N/A  . Number of Children: N/A  . Years of Education: N/A   Occupational History  . Not on file.   Social History Main Topics  . Smoking status: Former Smoker -- 28 years    Quit date: 05/26/2004  . Smokeless tobacco: Not on file  . Alcohol Use: No  . Drug Use: No  . Sexual Activity: Not on file   Other Topics Concern  . Not on file   Social History Narrative   Family History Family History  Problem Relation Age of Onset  . Family history unknown: Yes   Current Outpatient Prescriptions on File Prior to Visit  Medication Sig Dispense Refill  . aspirin EC 81 MG tablet Take 1 tablet (81 mg total) by mouth daily. (Patient not taking: Reported on 01/05/2015) 30 tablet 0  . fenofibrate (TRICOR) 145 MG tablet Take 145 mg by mouth daily.     Marland Kitchen lisinopril (PRINIVIL,ZESTRIL) 5 MG tablet Take 5 mg by mouth daily.     . metFORMIN (GLUCOPHAGE-XR) 500 MG 24 hr tablet Take 1 tablet (500 mg total) by mouth daily. Do not restart this medication until Sunday 07/05/14. 30 tablet 0  . metoCLOPramide (REGLAN) 10 MG tablet Take 1 tablet (10 mg  total) by mouth 2 (two) times daily after a meal. 6 tablet 0  . omega-3 acid ethyl esters (LOVAZA) 1 G capsule Take 1 g by mouth 2 (two) times daily.     . pantoprazole (PROTONIX) 40 MG tablet Take 1 tablet (40 mg total) by mouth daily at 6 (six) AM. 30 tablet 3  . pravastatin (PRAVACHOL) 20 MG tablet Take 20 mg by mouth daily.      No current facility-administered medications on file prior to visit.   No Known Allergies  ROS: See HPI for pertinent positives and negatives.    Physical Examination  Filed Vitals:   07/14/15 1026  BP: 133/77  Pulse: 66  Height: 5\' 8"  (1.727 m)  Weight: 146 lb (66.225 kg)  SpO2: 99%   Body mass  index is 22.2 kg/(m^2).  General: A&O x 3, WD, fit appearing trim Asian male.  Pulmonary: Sym exp, good air movt, CTAB, no rales, rhonchi, or wheezing.   Cardiac: RRR, Nl S1, S2, no detected murmur.   Carotid Bruits Right Left   Negative Negative    Aorta is faintly palpable. Radial pulses are 2+ and equal.                         VASCULAR EXAM: Extremities without ischemic changes, without Gangrene; without open wounds.                                                                                                          LE Pulses Right Left       FEMORAL  2+ palpable  2+ palpable        POPLITEAL  1+ palpable   1+ palpable       POSTERIOR TIBIAL  2+ palpable   2+ palpable        DORSALIS PEDIS      ANTERIOR TIBIAL not palpable  not palpable      Gastrointestinal: soft, NTND, -G/R, - HSM, - palpable masses, - CVAT B.  Musculoskeletal: M/S 5/5 throughout, Extremities without ischemic changes.  Neurologic: Pain and light touch intact in extremities, Motor exam as listed above.  Non-Invasive Vascular Imaging  ABI (Date: 07/14/2015)  Right: PTA 1.14, DPA 1.08, TBI: 0.86 with triphasic waveforms.  Left: PTA 1.14, DPA 1.11, TBI: 0.76 with triphasic waveforms.   Medical Decision Making  Frederick [name] is a 79 y.o. male who presents s/p open AAA repair.  Pt is asymptomatic with no abdominal pain, no claudication. He exercises extensively and regularly. 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 Pt states it is a hardship for him to return very often due to his age and 30 minute drive, states returning in a year is fine with him. I advised him to notify us if he develops pain or weakness in his legs with walking, or develops back or abdominal pain.  I discussed with the patient the importance of surveillance.  The next ABI will be scheduled for 12 months.  The patient will follow up with us in 12  months with these studies.  Thank you for  allowing [place] to participate in this patient's care.  Charisse March, RN, MSN, FNP-C [facility] [place] Office: [phone]   Clinic Physician: Hart Rochester   07/14/2015, 10:13 AM

## 2016-07-13 ENCOUNTER — Encounter: Payer: Self-pay | Admitting: Vascular Surgery

## 2016-07-18 ENCOUNTER — Encounter (HOSPITAL_COMMUNITY): Payer: Medicare HMO

## 2016-07-18 ENCOUNTER — Ambulatory Visit: Payer: Medicare HMO | Admitting: Family

## 2016-07-18 ENCOUNTER — Ambulatory Visit (HOSPITAL_COMMUNITY)
Admission: RE | Admit: 2016-07-18 | Discharge: 2016-07-18 | Disposition: A | Discharge status: Home / Self Care | Payer: Medicare HMO | Source: Ambulatory Visit | Attending: Family | Admitting: Family

## 2016-07-18 DIAGNOSIS — I723 Aneurysm of iliac artery: Secondary | ICD-10-CM | POA: Diagnosis present

## 2016-07-18 DIAGNOSIS — Z48812 Encounter for surgical aftercare following surgery on the circulatory system: Secondary | ICD-10-CM

## 2016-07-18 DIAGNOSIS — I714 Abdominal aortic aneurysm, without rupture, unspecified: Secondary | ICD-10-CM

## 2016-07-18 DIAGNOSIS — Z4889 Encounter for other specified surgical aftercare: Secondary | ICD-10-CM | POA: Diagnosis not present

## 2016-07-18 DIAGNOSIS — Z9889 Other specified postprocedural states: Secondary | ICD-10-CM | POA: Diagnosis present

## 2016-07-18 DIAGNOSIS — Z8679 Personal history of other diseases of the circulatory system: Secondary | ICD-10-CM | POA: Diagnosis not present

## 2016-07-26 ENCOUNTER — Encounter: Payer: Self-pay | Admitting: Family

## 2016-07-26 ENCOUNTER — Ambulatory Visit (INDEPENDENT_AMBULATORY_CARE_PROVIDER_SITE_OTHER): Payer: Medicare HMO | Admitting: Family

## 2016-07-26 VITALS — BP 127/77 | HR 70 | Temp 97.8°F | Resp 18 | Ht 68.0 in | Wt 154.0 lb

## 2016-07-26 DIAGNOSIS — Z8679 Personal history of other diseases of the circulatory system: Secondary | ICD-10-CM | POA: Diagnosis not present

## 2016-07-26 DIAGNOSIS — I723 Aneurysm of iliac artery: Secondary | ICD-10-CM

## 2016-07-26 DIAGNOSIS — I714 Abdominal aortic aneurysm, without rupture, unspecified: Secondary | ICD-10-CM

## 2016-07-26 DIAGNOSIS — Z9889 Other specified postprocedural states: Secondary | ICD-10-CM

## 2016-07-26 NOTE — Progress Notes (Signed)
CC: Follow up s/p Open AAA Repair   History of Present Illness  Frederick [name] is a 81 y.o. ([birth year]) male patient of Dr. Hart Rochester who is status post open resection and grafting of abdominal aortic and right common iliac artery aneurysm with ligation right internal iliac artery on 08/05/14.  This was complicated by a readmission postoperatively for a prolonged ileus.   He no longer has right buttock claudication. He denies any other claudication.  His appetite is good.   He exercises and walks extensively and regularly.  Pt Diabetic: Yes, in good control Pt smoker: former smoker, quit over 25 years ago   Past Medical History:  Diagnosis Date  . AAA (abdominal aortic aneurysm) (HCC)   . Arthritis   . Constipation   . Diabetes mellitus without complication (HCC)    Type 2  . Hyperlipidemia   . Hypertension     Social History Social History  Substance Use Topics  . Smoking status: Former Smoker    Years: 28.00    Quit date: 05/26/2004  . Smokeless tobacco: Never Used  . Alcohol use No    Family History Family History  Problem Relation Age of Onset  . Family history unknown: Yes    Surgical History Past Surgical History:  Procedure Laterality Date  . ABDOMINAL ADHESION SURGERY     for small bowel adhesions  . AORTOILIAC BYPASS N/A 08/05/2014   Procedure: RESECTION AND GRAFTING OF ABDOMINAL AORTIC ANEURYSM AND INSERTION OF AORTA TO RIGHT EXTERNAL ILIAC & LEFT COMMON ILIAC WITH 14X 8 MM X40 CM HEMASHIELD GRAFT;  Surgeon: Pryor Ochoa, MD;  Location: [facility] OR;  Service: Vascular;  Laterality: N/A;  . COLONOSCOPY    . EMBOLIZATION Right 06/25/2014   Procedure: EMBOLIZATION;  Surgeon: Fransisco Hertz, MD;  Location: [facility] CATH LAB;  Service: Cardiovascular;  Laterality: Right;  . EMBOLIZATION N/A 07/06/2014   Procedure: EMBOLIZATION;  Surgeon: Fransisco Hertz, MD;  Location: [facility] CATH LAB;  Service: Cardiovascular;  Laterality: N/A;  . ESOPHAGOGASTRODUODENOSCOPY N/A 08/20/2014   Procedure: ESOPHAGOGASTRODUODENOSCOPY (EGD);  Surgeon: Rachael Fee, MD;  Location: [facility] ENDOSCOPY;  Service: Endoscopy;  Laterality: N/A;  . EYE SURGERY     cataracts  . THROMBECTOMY FEMORAL ARTERY Left 06/25/2014   Procedure: Left Femoral Thrombectomy with Bilateral Leg Run Offs;  Surgeon: Fransisco Hertz, MD;  Location: [facility] OR;  Service: Vascular;  Laterality: Left;    No Known Allergies  Current Outpatient Prescriptions  Medication Sig Dispense Refill  . aspirin EC 81 MG tablet Take 1 tablet (81 mg total) by mouth daily. 30 tablet 0  . fenofibrate (TRICOR) 145 MG tablet Take 145 mg by mouth daily.     Marland Kitchen lisinopril (PRINIVIL,ZESTRIL) 5 MG tablet Take 5 mg by mouth daily.     . metFORMIN (GLUCOPHAGE-XR) 500 MG 24 hr tablet Take 1 tablet (500 mg total) by mouth daily. Do not restart this medication until Sunday 07/05/14. 30 tablet 0  . omega-3 acid ethyl esters (LOVAZA) 1 G capsule Take 1 g by mouth 2 (two) times daily.     . pantoprazole (PROTONIX) 40 MG tablet Take 1 tablet (40 mg total) by mouth daily at 6 (six) AM. 30 tablet 3  . pravastatin (PRAVACHOL) 20 MG tablet Take 20 mg by mouth daily.     . metoCLOPramide (REGLAN) 10 MG tablet Take 1 tablet (10 mg total) by mouth 2 (two) times daily after a meal. 6 tablet 0   No current  facility-administered medications for this visit.     REVIEW OF SYSTEMS: see HPI for pertinent positives and negatives.    Physical Examination  Vitals:   07/26/16 1006  BP: 127/77  Pulse: 70  Resp: 18  Temp: 97.8 F (36.6 C)  TempSrc: Oral  SpO2: 98%  Weight: 154 lb (69.9 kg)  Height: 5\' 8"  (1.727 m)   Body mass index is 23.42 kg/m.  General: A&O x 3, WD, fit appearing trim Asian male.  Pulmonary: Sym exp respirations are non labored,  good air movt, CTAB, no rales, rhonchi, or wheezing.   Cardiac: RRR, Nl S1, S2, no detected murmur.   Carotid Bruits Right Left   Negative Negative    Aorta is faintly palpable. Radial pulses are 2+  and equal.                         VASCULAR EXAM: Extremities without ischemic changes, without Gangrene; without open wounds.                                                                                                                                                       LE Pulses Right Left       FEMORAL  2+ palpable  2+ palpable       POPLITEAL  1+ palpable  1+ palpable       POSTERIOR TIBIAL  2+ palpable  2+ palpable       DORSALIS PEDIS      ANTERIOR TIBIAL 1+ palpable not palpable     Gastrointestinal: soft, NTND, -G/R, - HSM, - palpable masses, - CVAT B.  Musculoskeletal: M/S 5/5 throughout, Extremities without ischemic changes.  Neurologic: Pain and light touch intact in extremities, Motor exam as listed above.    Non-Invasive Vascular Imaging  ABI's: Right: 1.12 (1.14, 08-13-14), TBI: 0.80 Normal) Left: 1.15 (1.14), TBI: 0.76 (normal) All waveforms are triphasic  Medical Decision Making  Frederick [name] is a 81 y.o. male who is status post open resection and grafting of abdominal aortic and right common iliac artery aneurysm with ligation right internal iliac artery on 08/05/14.  He remains asymptomatic.  ABI's and TBI's remain normal.  He exercises extensively and regularly. 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 Pt states it is a hardship for him to return very often due to his age and 40 minute drive, states returning in a year is fine with him. I advised him to notify us if he develops pain or weakness in his legs with walking, or develops back or abdominal pain.  I discussed with the patient the importance of surveillance.  The next ABI will be scheduled for 12 months.  The patient will follow up with [place] in 12 months with these studies.   Thank you for allowing [place] to participate  in this patient's care.  NICKEL, Carma LairSUZANNE L, RN, MSN, FNP-C [facility] [place] Office: [phone]  07/26/2016, 10:22 AM  Clinic MD:  Randie Heinzain

## 2016-07-27 ENCOUNTER — Encounter: Payer: Self-pay | Admitting: Cardiology

## 2016-07-27 NOTE — Addendum Note (Signed)
Addended by: Burton ApleyPETTY, Jakaleb Payer A on: 07/27/2016 09:55 AM   Modules accepted: Orders

## 2017-01-15 DIAGNOSIS — I10 Essential (primary) hypertension: Secondary | ICD-10-CM | POA: Diagnosis not present

## 2017-01-15 DIAGNOSIS — Z6829 Body mass index (BMI) 29.0-29.9, adult: Secondary | ICD-10-CM | POA: Diagnosis not present

## 2017-01-15 DIAGNOSIS — E785 Hyperlipidemia, unspecified: Secondary | ICD-10-CM | POA: Diagnosis not present

## 2017-01-15 DIAGNOSIS — E1121 Type 2 diabetes mellitus with diabetic nephropathy: Secondary | ICD-10-CM | POA: Diagnosis not present

## 2017-01-15 DIAGNOSIS — N183 Chronic kidney disease, stage 3 (moderate): Secondary | ICD-10-CM | POA: Diagnosis not present

## 2017-01-15 DIAGNOSIS — M159 Polyosteoarthritis, unspecified: Secondary | ICD-10-CM | POA: Diagnosis not present

## 2017-01-15 DIAGNOSIS — Z139 Encounter for screening, unspecified: Secondary | ICD-10-CM | POA: Diagnosis not present

## 2017-02-13 DIAGNOSIS — H26493 Other secondary cataract, bilateral: Secondary | ICD-10-CM | POA: Diagnosis not present

## 2017-02-13 DIAGNOSIS — E119 Type 2 diabetes mellitus without complications: Secondary | ICD-10-CM | POA: Diagnosis not present

## 2017-03-19 DIAGNOSIS — E1121 Type 2 diabetes mellitus with diabetic nephropathy: Secondary | ICD-10-CM | POA: Diagnosis not present

## 2017-03-19 DIAGNOSIS — E785 Hyperlipidemia, unspecified: Secondary | ICD-10-CM | POA: Diagnosis not present

## 2017-03-19 DIAGNOSIS — N183 Chronic kidney disease, stage 3 (moderate): Secondary | ICD-10-CM | POA: Diagnosis not present

## 2017-03-19 DIAGNOSIS — I10 Essential (primary) hypertension: Secondary | ICD-10-CM | POA: Diagnosis not present

## 2017-03-19 DIAGNOSIS — M159 Polyosteoarthritis, unspecified: Secondary | ICD-10-CM | POA: Diagnosis not present

## 2017-05-21 DIAGNOSIS — M159 Polyosteoarthritis, unspecified: Secondary | ICD-10-CM | POA: Diagnosis not present

## 2017-05-21 DIAGNOSIS — E1121 Type 2 diabetes mellitus with diabetic nephropathy: Secondary | ICD-10-CM | POA: Diagnosis not present

## 2017-05-21 DIAGNOSIS — Z6820 Body mass index (BMI) 20.0-20.9, adult: Secondary | ICD-10-CM | POA: Diagnosis not present

## 2017-05-21 DIAGNOSIS — E785 Hyperlipidemia, unspecified: Secondary | ICD-10-CM | POA: Diagnosis not present

## 2017-05-21 DIAGNOSIS — I10 Essential (primary) hypertension: Secondary | ICD-10-CM | POA: Diagnosis not present

## 2017-05-21 DIAGNOSIS — N183 Chronic kidney disease, stage 3 (moderate): Secondary | ICD-10-CM | POA: Diagnosis not present

## 2017-05-21 DIAGNOSIS — Z23 Encounter for immunization: Secondary | ICD-10-CM | POA: Diagnosis not present

## 2017-07-23 DIAGNOSIS — M159 Polyosteoarthritis, unspecified: Secondary | ICD-10-CM | POA: Diagnosis not present

## 2017-07-23 DIAGNOSIS — E1121 Type 2 diabetes mellitus with diabetic nephropathy: Secondary | ICD-10-CM | POA: Diagnosis not present

## 2017-07-23 DIAGNOSIS — Z681 Body mass index (BMI) 19 or less, adult: Secondary | ICD-10-CM | POA: Diagnosis not present

## 2017-07-23 DIAGNOSIS — E785 Hyperlipidemia, unspecified: Secondary | ICD-10-CM | POA: Diagnosis not present

## 2017-07-23 DIAGNOSIS — N183 Chronic kidney disease, stage 3 (moderate): Secondary | ICD-10-CM | POA: Diagnosis not present

## 2017-07-23 DIAGNOSIS — I10 Essential (primary) hypertension: Secondary | ICD-10-CM | POA: Diagnosis not present

## 2017-08-01 ENCOUNTER — Ambulatory Visit: Payer: Medicare HMO | Admitting: Family

## 2017-08-01 ENCOUNTER — Encounter (HOSPITAL_COMMUNITY): Payer: Medicare HMO

## 2017-08-07 ENCOUNTER — Encounter (HOSPITAL_COMMUNITY): Payer: Medicare HMO

## 2017-08-07 ENCOUNTER — Ambulatory Visit: Payer: Medicare HMO | Admitting: Family

## 2017-09-14 ENCOUNTER — Ambulatory Visit: Payer: Medicare HMO | Admitting: Family

## 2017-09-14 ENCOUNTER — Encounter (HOSPITAL_COMMUNITY): Payer: Medicare HMO

## 2017-09-21 DIAGNOSIS — E1121 Type 2 diabetes mellitus with diabetic nephropathy: Secondary | ICD-10-CM | POA: Diagnosis not present

## 2017-09-21 DIAGNOSIS — L309 Dermatitis, unspecified: Secondary | ICD-10-CM | POA: Diagnosis not present

## 2017-09-21 DIAGNOSIS — Z6820 Body mass index (BMI) 20.0-20.9, adult: Secondary | ICD-10-CM | POA: Diagnosis not present

## 2017-09-21 DIAGNOSIS — E785 Hyperlipidemia, unspecified: Secondary | ICD-10-CM | POA: Diagnosis not present

## 2017-09-21 DIAGNOSIS — I10 Essential (primary) hypertension: Secondary | ICD-10-CM | POA: Diagnosis not present

## 2017-09-21 DIAGNOSIS — M159 Polyosteoarthritis, unspecified: Secondary | ICD-10-CM | POA: Diagnosis not present

## 2017-11-02 ENCOUNTER — Encounter: Payer: Self-pay | Admitting: Family

## 2017-11-02 ENCOUNTER — Ambulatory Visit: Payer: Medicare HMO | Admitting: Family

## 2017-11-02 ENCOUNTER — Ambulatory Visit (HOSPITAL_COMMUNITY)
Admission: RE | Admit: 2017-11-02 | Discharge: 2017-11-02 | Disposition: A | Discharge status: Home / Self Care | Payer: Medicare HMO | Source: Ambulatory Visit | Attending: Family | Admitting: Family

## 2017-11-02 VITALS — BP 141/82 | HR 62 | Temp 97.3°F | Resp 20 | Ht 68.0 in | Wt 156.6 lb

## 2017-11-02 DIAGNOSIS — I714 Abdominal aortic aneurysm, without rupture, unspecified: Secondary | ICD-10-CM

## 2017-11-02 DIAGNOSIS — Z8679 Personal history of other diseases of the circulatory system: Secondary | ICD-10-CM

## 2017-11-02 DIAGNOSIS — Z95828 Presence of other vascular implants and grafts: Secondary | ICD-10-CM | POA: Diagnosis not present

## 2017-11-02 DIAGNOSIS — I10 Essential (primary) hypertension: Secondary | ICD-10-CM | POA: Diagnosis not present

## 2017-11-02 DIAGNOSIS — I723 Aneurysm of iliac artery: Secondary | ICD-10-CM

## 2017-11-02 DIAGNOSIS — E119 Type 2 diabetes mellitus without complications: Secondary | ICD-10-CM | POA: Diagnosis not present

## 2017-11-02 DIAGNOSIS — Z9889 Other specified postprocedural states: Secondary | ICD-10-CM | POA: Diagnosis not present

## 2017-11-02 DIAGNOSIS — Z87891 Personal history of nicotine dependence: Secondary | ICD-10-CM | POA: Insufficient documentation

## 2017-11-02 DIAGNOSIS — E785 Hyperlipidemia, unspecified: Secondary | ICD-10-CM | POA: Diagnosis not present

## 2017-11-02 NOTE — Patient Instructions (Signed)

## 2017-11-02 NOTE — Progress Notes (Signed)
[facility] [place]  CC: Follow up Open AAA Repair  History of Present Illness  Frederick [name] is a 82 y.o. (10-08-1935) male who is status post open resection and grafting of abdominal aortic and right common iliac artery aneurysm with ligation right internal iliac artery on 08/05/14 by Dr. Hart Rochester. This was complicated by a readmission postoperatively for a prolonged ileus.   He no longer has right buttock claudication. He denies any other claudication. His appetite is good.   He exercises and walks extensively and regularly.  Pt Diabetic: Yes, no A1C result on file, FBS is 95 per pt Pt smoker: former smoker, quit in 2005   Past Medical History:  Diagnosis Date  . AAA (abdominal aortic aneurysm) (HCC)   . Arthritis   . Constipation   . Diabetes mellitus without complication (HCC)    Type 2  . Hyperlipidemia   . Hypertension     Past Surgical History:  Procedure Laterality Date  . ABDOMINAL ADHESION SURGERY     for small bowel adhesions  . AORTOILIAC BYPASS N/A 08/05/2014   Procedure: RESECTION AND GRAFTING OF ABDOMINAL AORTIC ANEURYSM AND INSERTION OF AORTA TO RIGHT EXTERNAL ILIAC & LEFT COMMON ILIAC WITH 14X 8 MM X40 CM HEMASHIELD GRAFT;  Surgeon: Pryor Ochoa, MD;  Location: [facility] OR;  Service: Vascular;  Laterality: N/A;  . COLONOSCOPY    . EMBOLIZATION Right 06/25/2014   Procedure: EMBOLIZATION;  Surgeon: Fransisco Hertz, MD;  Location: [facility] CATH LAB;  Service: Cardiovascular;  Laterality: Right;  . EMBOLIZATION N/A 07/06/2014   Procedure: EMBOLIZATION;  Surgeon: Fransisco Hertz, MD;  Location: [facility] CATH LAB;  Service: Cardiovascular;  Laterality: N/A;  . ESOPHAGOGASTRODUODENOSCOPY N/A 08/20/2014   Procedure: ESOPHAGOGASTRODUODENOSCOPY (EGD);  Surgeon: Rachael Fee, MD;  Location: [facility] ENDOSCOPY;  Service: Endoscopy;  Laterality: N/A;  . EYE SURGERY     cataracts  . THROMBECTOMY FEMORAL ARTERY Left 06/25/2014   Procedure: Left Femoral Thrombectomy with  Bilateral Leg Run Offs;  Surgeon: Fransisco Hertz, MD;  Location: [facility] OR;  Service: Vascular;  Laterality: Left;   Social History Social History   Socioeconomic History  . Marital status: Married    Spouse name: Not on file  . Number of children: Not on file  . Years of education: Not on file  . Highest education level: Not on file  Occupational History  . Not on file  Social Needs  . Financial resource strain: Not on file  . Food insecurity:    Worry: Not on file    Inability: Not on file  . Transportation needs:    Medical: Not on file    Non-medical: Not on file  Tobacco Use  . Smoking status: Former Smoker    Years: 28.00    Last attempt to quit: 05/26/2004    Years since quitting: 13.4  . Smokeless tobacco: Never Used  Substance and Sexual Activity  . Alcohol use: No    Alcohol/week: 0.0 oz  . Drug use: No  . Sexual activity: Not on file  Lifestyle  . Physical activity:    Days per week: Not on file    Minutes per session: Not on file  . Stress: Not on file  Relationships  . Social connections:    Talks on phone: Not on file    Gets together: Not on file    Attends religious service: Not on file    Active member of club or organization: Not on file  Attends meetings of clubs or organizations: Not on file    Relationship status: Not on file  . Intimate partner violence:    Fear of current or ex partner: Not on file    Emotionally abused: Not on file    Physically abused: Not on file    Forced sexual activity: Not on file  Other Topics Concern  . Not on file  Social History Narrative  . Not on file   Family History Family History  Family history unknown: Yes   Current Outpatient Medications on File Prior to Visit  Medication Sig Dispense Refill  . aspirin EC 81 MG tablet Take 1 tablet (81 mg total) by mouth daily. 30 tablet 0  . augmented betamethasone dipropionate (DIPROLENE-AF) 0.05 % cream   5  . fenofibrate (TRICOR) 145 MG tablet Take 145 mg by mouth  daily.     Marland Kitchen. lisinopril (PRINIVIL,ZESTRIL) 5 MG tablet Take 5 mg by mouth daily.     . metFORMIN (GLUCOPHAGE-XR) 500 MG 24 hr tablet Take 1 tablet (500 mg total) by mouth daily. Do not restart this medication until Sunday 07/05/14. 30 tablet 0  . omega-3 acid ethyl esters (LOVAZA) 1 G capsule Take 1 g by mouth 2 (two) times daily.     . pantoprazole (PROTONIX) 40 MG tablet Take 1 tablet (40 mg total) by mouth daily at 6 (six) AM. 30 tablet 3  . pravastatin (PRAVACHOL) 20 MG tablet Take 20 mg by mouth daily.     . metoCLOPramide (REGLAN) 10 MG tablet Take 1 tablet (10 mg total) by mouth 2 (two) times daily after a meal. 6 tablet 0   No current facility-administered medications on file prior to visit.    No Known Allergies  ROS: See HPI for pertinent positives and negatives.    Physical Examination  Vitals:   11/02/17 1230  BP: (!) 141/82  Pulse: 62  Resp: 20  Temp: (!) 97.3 F (36.3 C)  TempSrc: Oral  SpO2: 98%  Weight: 156 lb 9.6 oz (71 kg)  Height: 5\' 8"  (1.727 m)   Body mass index is 23.81 kg/m.  General: A&O x 3, WD, fit appearing trim Asian male. HEENT: No gross abnormalities  Pulmonary: Sym exp respirations are non labored,  good air movt, CTAB, no rales, rhonchi, or wheezing.  Cardiac: Regular rhythm and rate, no detected murmur.   Carotid Bruits Right Left   Negative Negative   Abdominal aortic pulse is faintly palpable. Radial pulses are 2+ and equal.   VASCULAR EXAM: Extremitieswithoutischemic changes, withoutGangrene; withoutopen wounds.  LE Pulses Right Left  FEMORAL 2+ palpable 2+ palpable  POPLITEAL 1+ palpable 1+ palpable  POSTERIOR TIBIAL 2+ palpable 2+ palpable  DORSALIS PEDIS ANTERIOR TIBIAL 1+ palpable not palpable   Gastrointestinal: soft, NTND, -G/R, - HSM, - palpable masses, - CVAT B. Musculoskeletal: M/S 5/5 throughout, Extremities without ischemic changes. Skin: No  rash, no cellulitis, no ulcers.  Neurologic: Pain and light touch intact in extremities, CN 2-12 intact, Motor exam as listed above. Psychiatric: Mood appropriate for clinical situation   Non-Invasive Vascular Imaging  ABI (Date: 11/02/2017):  R:   ABI: 1.08 (was 1.12 on 07-18-16),   PT: tri  DP: tri  TBI:  0.81 (was 0.80)  L:   ABI: 1.05 (was 1.15),   PT: tri  DP: tri  TBI: 0.67 (was 0.76)  Bilateral ABI's remain normal with all triphasic waveforms.    Medical Decision Making  Roczen Muk Selena BattenKim is a 82 y.o.  male who is status post open resection and grafting of abdominal aortic and right common iliac artery aneurysm with ligation right internal iliac artery on 08/05/14.  He remains asymptomatic.   He exercises extensively and regularly. 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 Pt states it is a hardship for him to return very often due to his age and 40 minute drive, states returning in a year is fine with him. I advised him to notify us if he develops pain or weakness in his legs with walking, or develops back or abdominal pain  I discussed with the patient the importance of surveillance.  The next ABI will be scheduled for 18 months.  The patient will follow up with [place] in 18 months with these studies.  I discussed in depth with the patient the nature of atherosclerosis, and emphasized the importance of maximal medical management including strict control of blood pressure, blood glucose, and lipid levels, obtaining regular exercise, and cessation of smoking.    The patient is aware that without maximal medical management the underlying atherosclerotic disease process will progress, limiting the benefit of any interventions.   Thank you for allowing [place] to participate in this patient's care.  Charisse March, RN, MSN, FNP-C [facility] [place] Office: [phone]   Clinic Physician: Cain/Fields   11/02/2017, 12:37 PM

## 2017-11-21 DIAGNOSIS — E785 Hyperlipidemia, unspecified: Secondary | ICD-10-CM | POA: Diagnosis not present

## 2017-11-21 DIAGNOSIS — N183 Chronic kidney disease, stage 3 (moderate): Secondary | ICD-10-CM | POA: Diagnosis not present

## 2017-11-21 DIAGNOSIS — E1121 Type 2 diabetes mellitus with diabetic nephropathy: Secondary | ICD-10-CM | POA: Diagnosis not present

## 2017-11-21 DIAGNOSIS — I10 Essential (primary) hypertension: Secondary | ICD-10-CM | POA: Diagnosis not present

## 2017-11-21 DIAGNOSIS — M159 Polyosteoarthritis, unspecified: Secondary | ICD-10-CM | POA: Diagnosis not present

## 2018-01-21 DIAGNOSIS — E785 Hyperlipidemia, unspecified: Secondary | ICD-10-CM | POA: Diagnosis not present

## 2018-01-21 DIAGNOSIS — E1121 Type 2 diabetes mellitus with diabetic nephropathy: Secondary | ICD-10-CM | POA: Diagnosis not present

## 2018-01-21 DIAGNOSIS — Z681 Body mass index (BMI) 19 or less, adult: Secondary | ICD-10-CM | POA: Diagnosis not present

## 2018-01-21 DIAGNOSIS — M159 Polyosteoarthritis, unspecified: Secondary | ICD-10-CM | POA: Diagnosis not present

## 2018-01-21 DIAGNOSIS — N183 Chronic kidney disease, stage 3 (moderate): Secondary | ICD-10-CM | POA: Diagnosis not present

## 2018-01-21 DIAGNOSIS — Z1339 Encounter for screening examination for other mental health and behavioral disorders: Secondary | ICD-10-CM | POA: Diagnosis not present

## 2018-01-21 DIAGNOSIS — Z9181 History of falling: Secondary | ICD-10-CM | POA: Diagnosis not present

## 2018-01-21 DIAGNOSIS — Z1331 Encounter for screening for depression: Secondary | ICD-10-CM | POA: Diagnosis not present

## 2018-02-14 DIAGNOSIS — E119 Type 2 diabetes mellitus without complications: Secondary | ICD-10-CM | POA: Diagnosis not present

## 2018-02-14 DIAGNOSIS — H26493 Other secondary cataract, bilateral: Secondary | ICD-10-CM | POA: Diagnosis not present

## 2018-03-26 DIAGNOSIS — N183 Chronic kidney disease, stage 3 (moderate): Secondary | ICD-10-CM | POA: Diagnosis not present

## 2018-03-26 DIAGNOSIS — E1121 Type 2 diabetes mellitus with diabetic nephropathy: Secondary | ICD-10-CM | POA: Diagnosis not present

## 2018-03-26 DIAGNOSIS — Z6820 Body mass index (BMI) 20.0-20.9, adult: Secondary | ICD-10-CM | POA: Diagnosis not present

## 2018-03-26 DIAGNOSIS — E785 Hyperlipidemia, unspecified: Secondary | ICD-10-CM | POA: Diagnosis not present

## 2018-03-26 DIAGNOSIS — M159 Polyosteoarthritis, unspecified: Secondary | ICD-10-CM | POA: Diagnosis not present

## 2018-03-26 DIAGNOSIS — I10 Essential (primary) hypertension: Secondary | ICD-10-CM | POA: Diagnosis not present

## 2018-04-03 DIAGNOSIS — E785 Hyperlipidemia, unspecified: Secondary | ICD-10-CM | POA: Diagnosis not present

## 2018-04-03 DIAGNOSIS — Z125 Encounter for screening for malignant neoplasm of prostate: Secondary | ICD-10-CM | POA: Diagnosis not present

## 2018-04-03 DIAGNOSIS — Z9181 History of falling: Secondary | ICD-10-CM | POA: Diagnosis not present

## 2018-04-03 DIAGNOSIS — Z1331 Encounter for screening for depression: Secondary | ICD-10-CM | POA: Diagnosis not present

## 2018-04-03 DIAGNOSIS — Z139 Encounter for screening, unspecified: Secondary | ICD-10-CM | POA: Diagnosis not present

## 2018-04-03 DIAGNOSIS — Z0000 Encounter for general adult medical examination without abnormal findings: Secondary | ICD-10-CM | POA: Diagnosis not present

## 2018-05-15 DIAGNOSIS — Z23 Encounter for immunization: Secondary | ICD-10-CM | POA: Diagnosis not present

## 2018-05-15 DIAGNOSIS — Z6820 Body mass index (BMI) 20.0-20.9, adult: Secondary | ICD-10-CM | POA: Diagnosis not present

## 2018-05-15 DIAGNOSIS — M159 Polyosteoarthritis, unspecified: Secondary | ICD-10-CM | POA: Diagnosis not present

## 2018-05-15 DIAGNOSIS — I10 Essential (primary) hypertension: Secondary | ICD-10-CM | POA: Diagnosis not present

## 2018-05-15 DIAGNOSIS — N183 Chronic kidney disease, stage 3 (moderate): Secondary | ICD-10-CM | POA: Diagnosis not present

## 2018-05-15 DIAGNOSIS — E1121 Type 2 diabetes mellitus with diabetic nephropathy: Secondary | ICD-10-CM | POA: Diagnosis not present

## 2018-05-15 DIAGNOSIS — E785 Hyperlipidemia, unspecified: Secondary | ICD-10-CM | POA: Diagnosis not present

## 2018-05-29 DIAGNOSIS — Z1211 Encounter for screening for malignant neoplasm of colon: Secondary | ICD-10-CM | POA: Diagnosis not present

## 2018-05-30 DIAGNOSIS — J028 Acute pharyngitis due to other specified organisms: Secondary | ICD-10-CM | POA: Diagnosis not present

## 2018-05-30 DIAGNOSIS — N183 Chronic kidney disease, stage 3 (moderate): Secondary | ICD-10-CM | POA: Diagnosis not present

## 2018-05-30 DIAGNOSIS — E1121 Type 2 diabetes mellitus with diabetic nephropathy: Secondary | ICD-10-CM | POA: Diagnosis not present

## 2018-05-30 DIAGNOSIS — M159 Polyosteoarthritis, unspecified: Secondary | ICD-10-CM | POA: Diagnosis not present

## 2018-05-30 DIAGNOSIS — Z6820 Body mass index (BMI) 20.0-20.9, adult: Secondary | ICD-10-CM | POA: Diagnosis not present

## 2018-05-30 DIAGNOSIS — Z23 Encounter for immunization: Secondary | ICD-10-CM | POA: Diagnosis not present

## 2018-06-11 DIAGNOSIS — Z23 Encounter for immunization: Secondary | ICD-10-CM | POA: Diagnosis not present

## 2018-06-11 DIAGNOSIS — E1121 Type 2 diabetes mellitus with diabetic nephropathy: Secondary | ICD-10-CM | POA: Diagnosis not present

## 2018-06-11 DIAGNOSIS — N183 Chronic kidney disease, stage 3 (moderate): Secondary | ICD-10-CM | POA: Diagnosis not present

## 2018-06-11 DIAGNOSIS — E785 Hyperlipidemia, unspecified: Secondary | ICD-10-CM | POA: Diagnosis not present

## 2018-06-11 DIAGNOSIS — I10 Essential (primary) hypertension: Secondary | ICD-10-CM | POA: Diagnosis not present

## 2018-06-11 DIAGNOSIS — M159 Polyosteoarthritis, unspecified: Secondary | ICD-10-CM | POA: Diagnosis not present

## 2018-06-11 DIAGNOSIS — Z6820 Body mass index (BMI) 20.0-20.9, adult: Secondary | ICD-10-CM | POA: Diagnosis not present

## 2018-06-15 DIAGNOSIS — K122 Cellulitis and abscess of mouth: Secondary | ICD-10-CM | POA: Diagnosis not present

## 2018-08-12 DIAGNOSIS — I10 Essential (primary) hypertension: Secondary | ICD-10-CM | POA: Diagnosis not present

## 2018-08-12 DIAGNOSIS — E1121 Type 2 diabetes mellitus with diabetic nephropathy: Secondary | ICD-10-CM | POA: Diagnosis not present

## 2018-08-12 DIAGNOSIS — M159 Polyosteoarthritis, unspecified: Secondary | ICD-10-CM | POA: Diagnosis not present

## 2018-08-12 DIAGNOSIS — Z6820 Body mass index (BMI) 20.0-20.9, adult: Secondary | ICD-10-CM | POA: Diagnosis not present

## 2018-08-12 DIAGNOSIS — N183 Chronic kidney disease, stage 3 (moderate): Secondary | ICD-10-CM | POA: Diagnosis not present

## 2018-08-12 DIAGNOSIS — E785 Hyperlipidemia, unspecified: Secondary | ICD-10-CM | POA: Diagnosis not present

## 2018-10-16 DIAGNOSIS — Z6824 Body mass index (BMI) 24.0-24.9, adult: Secondary | ICD-10-CM | POA: Diagnosis not present

## 2018-10-16 DIAGNOSIS — N183 Chronic kidney disease, stage 3 (moderate): Secondary | ICD-10-CM | POA: Diagnosis not present

## 2018-10-16 DIAGNOSIS — I10 Essential (primary) hypertension: Secondary | ICD-10-CM | POA: Diagnosis not present

## 2018-10-16 DIAGNOSIS — E785 Hyperlipidemia, unspecified: Secondary | ICD-10-CM | POA: Diagnosis not present

## 2018-10-16 DIAGNOSIS — M159 Polyosteoarthritis, unspecified: Secondary | ICD-10-CM | POA: Diagnosis not present

## 2018-10-16 DIAGNOSIS — E1121 Type 2 diabetes mellitus with diabetic nephropathy: Secondary | ICD-10-CM | POA: Diagnosis not present

## 2018-10-24 DIAGNOSIS — I10 Essential (primary) hypertension: Secondary | ICD-10-CM | POA: Diagnosis not present

## 2018-10-24 DIAGNOSIS — E785 Hyperlipidemia, unspecified: Secondary | ICD-10-CM | POA: Diagnosis not present

## 2018-10-24 DIAGNOSIS — M25511 Pain in right shoulder: Secondary | ICD-10-CM | POA: Diagnosis not present

## 2018-10-24 DIAGNOSIS — N183 Chronic kidney disease, stage 3 (moderate): Secondary | ICD-10-CM | POA: Diagnosis not present

## 2018-10-24 DIAGNOSIS — E1121 Type 2 diabetes mellitus with diabetic nephropathy: Secondary | ICD-10-CM | POA: Diagnosis not present

## 2018-10-24 DIAGNOSIS — M159 Polyosteoarthritis, unspecified: Secondary | ICD-10-CM | POA: Diagnosis not present

## 2018-10-24 DIAGNOSIS — Z6824 Body mass index (BMI) 24.0-24.9, adult: Secondary | ICD-10-CM | POA: Diagnosis not present

## 2018-12-17 DIAGNOSIS — E1121 Type 2 diabetes mellitus with diabetic nephropathy: Secondary | ICD-10-CM | POA: Diagnosis not present

## 2018-12-17 DIAGNOSIS — I10 Essential (primary) hypertension: Secondary | ICD-10-CM | POA: Diagnosis not present

## 2018-12-17 DIAGNOSIS — Z6824 Body mass index (BMI) 24.0-24.9, adult: Secondary | ICD-10-CM | POA: Diagnosis not present

## 2018-12-17 DIAGNOSIS — N183 Chronic kidney disease, stage 3 (moderate): Secondary | ICD-10-CM | POA: Diagnosis not present

## 2018-12-17 DIAGNOSIS — M159 Polyosteoarthritis, unspecified: Secondary | ICD-10-CM | POA: Diagnosis not present

## 2018-12-17 DIAGNOSIS — M25511 Pain in right shoulder: Secondary | ICD-10-CM | POA: Diagnosis not present

## 2018-12-20 DIAGNOSIS — I10 Essential (primary) hypertension: Secondary | ICD-10-CM | POA: Diagnosis not present

## 2018-12-20 DIAGNOSIS — E1121 Type 2 diabetes mellitus with diabetic nephropathy: Secondary | ICD-10-CM | POA: Diagnosis not present

## 2018-12-20 DIAGNOSIS — Z6824 Body mass index (BMI) 24.0-24.9, adult: Secondary | ICD-10-CM | POA: Diagnosis not present

## 2018-12-20 DIAGNOSIS — M25511 Pain in right shoulder: Secondary | ICD-10-CM | POA: Diagnosis not present

## 2018-12-20 DIAGNOSIS — M159 Polyosteoarthritis, unspecified: Secondary | ICD-10-CM | POA: Diagnosis not present

## 2018-12-20 DIAGNOSIS — N183 Chronic kidney disease, stage 3 (moderate): Secondary | ICD-10-CM | POA: Diagnosis not present

## 2018-12-20 DIAGNOSIS — E785 Hyperlipidemia, unspecified: Secondary | ICD-10-CM | POA: Diagnosis not present

## 2018-12-26 DIAGNOSIS — E1121 Type 2 diabetes mellitus with diabetic nephropathy: Secondary | ICD-10-CM | POA: Diagnosis not present

## 2018-12-26 DIAGNOSIS — N183 Chronic kidney disease, stage 3 (moderate): Secondary | ICD-10-CM | POA: Diagnosis not present

## 2018-12-26 DIAGNOSIS — M25511 Pain in right shoulder: Secondary | ICD-10-CM | POA: Diagnosis not present

## 2018-12-26 DIAGNOSIS — I10 Essential (primary) hypertension: Secondary | ICD-10-CM | POA: Diagnosis not present

## 2018-12-26 DIAGNOSIS — M159 Polyosteoarthritis, unspecified: Secondary | ICD-10-CM | POA: Diagnosis not present

## 2018-12-26 DIAGNOSIS — Z6824 Body mass index (BMI) 24.0-24.9, adult: Secondary | ICD-10-CM | POA: Diagnosis not present

## 2018-12-26 DIAGNOSIS — E785 Hyperlipidemia, unspecified: Secondary | ICD-10-CM | POA: Diagnosis not present

## 2019-02-18 DIAGNOSIS — Z6824 Body mass index (BMI) 24.0-24.9, adult: Secondary | ICD-10-CM | POA: Diagnosis not present

## 2019-02-18 DIAGNOSIS — M25512 Pain in left shoulder: Secondary | ICD-10-CM | POA: Diagnosis not present

## 2019-02-18 DIAGNOSIS — E1121 Type 2 diabetes mellitus with diabetic nephropathy: Secondary | ICD-10-CM | POA: Diagnosis not present

## 2019-02-18 DIAGNOSIS — E785 Hyperlipidemia, unspecified: Secondary | ICD-10-CM | POA: Diagnosis not present

## 2019-02-18 DIAGNOSIS — N183 Chronic kidney disease, stage 3 (moderate): Secondary | ICD-10-CM | POA: Diagnosis not present

## 2019-02-18 DIAGNOSIS — I10 Essential (primary) hypertension: Secondary | ICD-10-CM | POA: Diagnosis not present

## 2019-02-18 DIAGNOSIS — M159 Polyosteoarthritis, unspecified: Secondary | ICD-10-CM | POA: Diagnosis not present

## 2019-02-18 DIAGNOSIS — M25511 Pain in right shoulder: Secondary | ICD-10-CM | POA: Diagnosis not present

## 2019-03-12 DIAGNOSIS — M159 Polyosteoarthritis, unspecified: Secondary | ICD-10-CM | POA: Diagnosis not present

## 2019-03-12 DIAGNOSIS — E1121 Type 2 diabetes mellitus with diabetic nephropathy: Secondary | ICD-10-CM | POA: Diagnosis not present

## 2019-03-12 DIAGNOSIS — N183 Chronic kidney disease, stage 3 (moderate): Secondary | ICD-10-CM | POA: Diagnosis not present

## 2019-03-12 DIAGNOSIS — M25511 Pain in right shoulder: Secondary | ICD-10-CM | POA: Diagnosis not present

## 2019-03-12 DIAGNOSIS — M25512 Pain in left shoulder: Secondary | ICD-10-CM | POA: Diagnosis not present

## 2019-03-12 DIAGNOSIS — Z6824 Body mass index (BMI) 24.0-24.9, adult: Secondary | ICD-10-CM | POA: Diagnosis not present

## 2019-03-12 DIAGNOSIS — E785 Hyperlipidemia, unspecified: Secondary | ICD-10-CM | POA: Diagnosis not present

## 2019-03-12 DIAGNOSIS — I10 Essential (primary) hypertension: Secondary | ICD-10-CM | POA: Diagnosis not present

## 2019-04-21 DIAGNOSIS — I10 Essential (primary) hypertension: Secondary | ICD-10-CM | POA: Diagnosis not present

## 2019-04-21 DIAGNOSIS — Z1331 Encounter for screening for depression: Secondary | ICD-10-CM | POA: Diagnosis not present

## 2019-04-21 DIAGNOSIS — E1121 Type 2 diabetes mellitus with diabetic nephropathy: Secondary | ICD-10-CM | POA: Diagnosis not present

## 2019-04-21 DIAGNOSIS — M25511 Pain in right shoulder: Secondary | ICD-10-CM | POA: Diagnosis not present

## 2019-04-21 DIAGNOSIS — Z23 Encounter for immunization: Secondary | ICD-10-CM | POA: Diagnosis not present

## 2019-04-21 DIAGNOSIS — M159 Polyosteoarthritis, unspecified: Secondary | ICD-10-CM | POA: Diagnosis not present

## 2019-04-21 DIAGNOSIS — Z139 Encounter for screening, unspecified: Secondary | ICD-10-CM | POA: Diagnosis not present

## 2019-04-21 DIAGNOSIS — Z9181 History of falling: Secondary | ICD-10-CM | POA: Diagnosis not present

## 2019-04-21 DIAGNOSIS — N183 Chronic kidney disease, stage 3 (moderate): Secondary | ICD-10-CM | POA: Diagnosis not present

## 2019-04-21 DIAGNOSIS — E785 Hyperlipidemia, unspecified: Secondary | ICD-10-CM | POA: Diagnosis not present

## 2019-06-16 DIAGNOSIS — E785 Hyperlipidemia, unspecified: Secondary | ICD-10-CM | POA: Diagnosis not present

## 2019-06-16 DIAGNOSIS — I10 Essential (primary) hypertension: Secondary | ICD-10-CM | POA: Diagnosis not present

## 2019-06-16 DIAGNOSIS — M159 Polyosteoarthritis, unspecified: Secondary | ICD-10-CM | POA: Diagnosis not present

## 2019-06-16 DIAGNOSIS — N183 Chronic kidney disease, stage 3 unspecified: Secondary | ICD-10-CM | POA: Diagnosis not present

## 2019-06-16 DIAGNOSIS — M25551 Pain in right hip: Secondary | ICD-10-CM | POA: Diagnosis not present

## 2019-06-16 DIAGNOSIS — Z6823 Body mass index (BMI) 23.0-23.9, adult: Secondary | ICD-10-CM | POA: Diagnosis not present

## 2019-06-16 DIAGNOSIS — E1121 Type 2 diabetes mellitus with diabetic nephropathy: Secondary | ICD-10-CM | POA: Diagnosis not present

## 2019-06-30 DIAGNOSIS — Z6824 Body mass index (BMI) 24.0-24.9, adult: Secondary | ICD-10-CM | POA: Diagnosis not present

## 2019-06-30 DIAGNOSIS — I10 Essential (primary) hypertension: Secondary | ICD-10-CM | POA: Diagnosis not present

## 2019-06-30 DIAGNOSIS — M159 Polyosteoarthritis, unspecified: Secondary | ICD-10-CM | POA: Diagnosis not present

## 2019-06-30 DIAGNOSIS — N183 Chronic kidney disease, stage 3 unspecified: Secondary | ICD-10-CM | POA: Diagnosis not present

## 2019-06-30 DIAGNOSIS — E785 Hyperlipidemia, unspecified: Secondary | ICD-10-CM | POA: Diagnosis not present

## 2019-06-30 DIAGNOSIS — E1121 Type 2 diabetes mellitus with diabetic nephropathy: Secondary | ICD-10-CM | POA: Diagnosis not present

## 2019-06-30 DIAGNOSIS — M25511 Pain in right shoulder: Secondary | ICD-10-CM | POA: Diagnosis not present

## 2019-06-30 DIAGNOSIS — E1122 Type 2 diabetes mellitus with diabetic chronic kidney disease: Secondary | ICD-10-CM | POA: Diagnosis not present

## 2019-08-20 DIAGNOSIS — Z961 Presence of intraocular lens: Secondary | ICD-10-CM | POA: Diagnosis not present

## 2019-08-20 DIAGNOSIS — E119 Type 2 diabetes mellitus without complications: Secondary | ICD-10-CM | POA: Diagnosis not present

## 2019-09-04 DIAGNOSIS — M25511 Pain in right shoulder: Secondary | ICD-10-CM | POA: Diagnosis not present

## 2019-09-04 DIAGNOSIS — I10 Essential (primary) hypertension: Secondary | ICD-10-CM | POA: Diagnosis not present

## 2019-09-04 DIAGNOSIS — M159 Polyosteoarthritis, unspecified: Secondary | ICD-10-CM | POA: Diagnosis not present

## 2019-09-04 DIAGNOSIS — Z6824 Body mass index (BMI) 24.0-24.9, adult: Secondary | ICD-10-CM | POA: Diagnosis not present

## 2019-09-04 DIAGNOSIS — E785 Hyperlipidemia, unspecified: Secondary | ICD-10-CM | POA: Diagnosis not present

## 2019-09-04 DIAGNOSIS — N1832 Chronic kidney disease, stage 3b: Secondary | ICD-10-CM | POA: Diagnosis not present

## 2019-09-04 DIAGNOSIS — E1121 Type 2 diabetes mellitus with diabetic nephropathy: Secondary | ICD-10-CM | POA: Diagnosis not present

## 2019-09-17 DIAGNOSIS — R42 Dizziness and giddiness: Secondary | ICD-10-CM | POA: Diagnosis not present

## 2019-09-17 DIAGNOSIS — I10 Essential (primary) hypertension: Secondary | ICD-10-CM | POA: Diagnosis not present

## 2019-09-17 DIAGNOSIS — Z6824 Body mass index (BMI) 24.0-24.9, adult: Secondary | ICD-10-CM | POA: Diagnosis not present

## 2019-09-17 DIAGNOSIS — M25511 Pain in right shoulder: Secondary | ICD-10-CM | POA: Diagnosis not present

## 2019-09-17 DIAGNOSIS — M159 Polyosteoarthritis, unspecified: Secondary | ICD-10-CM | POA: Diagnosis not present

## 2019-09-17 DIAGNOSIS — M25512 Pain in left shoulder: Secondary | ICD-10-CM | POA: Diagnosis not present

## 2019-09-17 DIAGNOSIS — E785 Hyperlipidemia, unspecified: Secondary | ICD-10-CM | POA: Diagnosis not present

## 2019-09-17 DIAGNOSIS — E1121 Type 2 diabetes mellitus with diabetic nephropathy: Secondary | ICD-10-CM | POA: Diagnosis not present

## 2019-09-17 DIAGNOSIS — N1832 Chronic kidney disease, stage 3b: Secondary | ICD-10-CM | POA: Diagnosis not present

## 2019-10-01 DIAGNOSIS — M159 Polyosteoarthritis, unspecified: Secondary | ICD-10-CM | POA: Diagnosis not present

## 2019-10-01 DIAGNOSIS — I10 Essential (primary) hypertension: Secondary | ICD-10-CM | POA: Diagnosis not present

## 2019-10-01 DIAGNOSIS — N1832 Chronic kidney disease, stage 3b: Secondary | ICD-10-CM | POA: Diagnosis not present

## 2019-10-01 DIAGNOSIS — Z6824 Body mass index (BMI) 24.0-24.9, adult: Secondary | ICD-10-CM | POA: Diagnosis not present

## 2019-10-01 DIAGNOSIS — E785 Hyperlipidemia, unspecified: Secondary | ICD-10-CM | POA: Diagnosis not present

## 2019-10-01 DIAGNOSIS — M25511 Pain in right shoulder: Secondary | ICD-10-CM | POA: Diagnosis not present

## 2019-10-01 DIAGNOSIS — R42 Dizziness and giddiness: Secondary | ICD-10-CM | POA: Diagnosis not present

## 2019-10-01 DIAGNOSIS — E1121 Type 2 diabetes mellitus with diabetic nephropathy: Secondary | ICD-10-CM | POA: Diagnosis not present

## 2019-11-07 DIAGNOSIS — N1832 Chronic kidney disease, stage 3b: Secondary | ICD-10-CM | POA: Diagnosis not present

## 2019-11-07 DIAGNOSIS — M25511 Pain in right shoulder: Secondary | ICD-10-CM | POA: Diagnosis not present

## 2019-11-07 DIAGNOSIS — M159 Polyosteoarthritis, unspecified: Secondary | ICD-10-CM | POA: Diagnosis not present

## 2019-11-07 DIAGNOSIS — E1121 Type 2 diabetes mellitus with diabetic nephropathy: Secondary | ICD-10-CM | POA: Diagnosis not present

## 2019-11-07 DIAGNOSIS — I10 Essential (primary) hypertension: Secondary | ICD-10-CM | POA: Diagnosis not present

## 2019-11-07 DIAGNOSIS — Z6824 Body mass index (BMI) 24.0-24.9, adult: Secondary | ICD-10-CM | POA: Diagnosis not present

## 2019-11-07 DIAGNOSIS — E785 Hyperlipidemia, unspecified: Secondary | ICD-10-CM | POA: Diagnosis not present

## 2020-01-09 DIAGNOSIS — M25511 Pain in right shoulder: Secondary | ICD-10-CM | POA: Diagnosis not present

## 2020-01-09 DIAGNOSIS — M25512 Pain in left shoulder: Secondary | ICD-10-CM | POA: Diagnosis not present

## 2020-01-09 DIAGNOSIS — Z6823 Body mass index (BMI) 23.0-23.9, adult: Secondary | ICD-10-CM | POA: Diagnosis not present

## 2020-01-09 DIAGNOSIS — I10 Essential (primary) hypertension: Secondary | ICD-10-CM | POA: Diagnosis not present

## 2020-01-09 DIAGNOSIS — N1832 Chronic kidney disease, stage 3b: Secondary | ICD-10-CM | POA: Diagnosis not present

## 2020-01-09 DIAGNOSIS — E785 Hyperlipidemia, unspecified: Secondary | ICD-10-CM | POA: Diagnosis not present

## 2020-01-09 DIAGNOSIS — E1121 Type 2 diabetes mellitus with diabetic nephropathy: Secondary | ICD-10-CM | POA: Diagnosis not present

## 2020-01-09 DIAGNOSIS — M159 Polyosteoarthritis, unspecified: Secondary | ICD-10-CM | POA: Diagnosis not present

## 2020-01-30 DIAGNOSIS — M159 Polyosteoarthritis, unspecified: Secondary | ICD-10-CM | POA: Diagnosis not present

## 2020-01-30 DIAGNOSIS — E785 Hyperlipidemia, unspecified: Secondary | ICD-10-CM | POA: Diagnosis not present

## 2020-01-30 DIAGNOSIS — Z6823 Body mass index (BMI) 23.0-23.9, adult: Secondary | ICD-10-CM | POA: Diagnosis not present

## 2020-01-30 DIAGNOSIS — M25511 Pain in right shoulder: Secondary | ICD-10-CM | POA: Diagnosis not present

## 2020-01-30 DIAGNOSIS — M25512 Pain in left shoulder: Secondary | ICD-10-CM | POA: Diagnosis not present

## 2020-01-30 DIAGNOSIS — E1121 Type 2 diabetes mellitus with diabetic nephropathy: Secondary | ICD-10-CM | POA: Diagnosis not present

## 2020-01-30 DIAGNOSIS — I10 Essential (primary) hypertension: Secondary | ICD-10-CM | POA: Diagnosis not present

## 2020-01-30 DIAGNOSIS — N1832 Chronic kidney disease, stage 3b: Secondary | ICD-10-CM | POA: Diagnosis not present

## 2020-02-27 DIAGNOSIS — M159 Polyosteoarthritis, unspecified: Secondary | ICD-10-CM | POA: Diagnosis not present

## 2020-02-27 DIAGNOSIS — M25512 Pain in left shoulder: Secondary | ICD-10-CM | POA: Diagnosis not present

## 2020-02-27 DIAGNOSIS — E1121 Type 2 diabetes mellitus with diabetic nephropathy: Secondary | ICD-10-CM | POA: Diagnosis not present

## 2020-02-27 DIAGNOSIS — M25511 Pain in right shoulder: Secondary | ICD-10-CM | POA: Diagnosis not present

## 2020-02-27 DIAGNOSIS — Z6823 Body mass index (BMI) 23.0-23.9, adult: Secondary | ICD-10-CM | POA: Diagnosis not present

## 2020-02-27 DIAGNOSIS — I10 Essential (primary) hypertension: Secondary | ICD-10-CM | POA: Diagnosis not present

## 2020-02-27 DIAGNOSIS — E785 Hyperlipidemia, unspecified: Secondary | ICD-10-CM | POA: Diagnosis not present

## 2020-02-27 DIAGNOSIS — N1832 Chronic kidney disease, stage 3b: Secondary | ICD-10-CM | POA: Diagnosis not present

## 2020-03-05 DIAGNOSIS — I8393 Asymptomatic varicose veins of bilateral lower extremities: Secondary | ICD-10-CM | POA: Diagnosis not present

## 2020-04-29 DIAGNOSIS — E1121 Type 2 diabetes mellitus with diabetic nephropathy: Secondary | ICD-10-CM | POA: Diagnosis not present

## 2020-04-29 DIAGNOSIS — Z9181 History of falling: Secondary | ICD-10-CM | POA: Diagnosis not present

## 2020-04-29 DIAGNOSIS — Z139 Encounter for screening, unspecified: Secondary | ICD-10-CM | POA: Diagnosis not present

## 2020-04-29 DIAGNOSIS — N1832 Chronic kidney disease, stage 3b: Secondary | ICD-10-CM | POA: Diagnosis not present

## 2020-04-29 DIAGNOSIS — M159 Polyosteoarthritis, unspecified: Secondary | ICD-10-CM | POA: Diagnosis not present

## 2020-04-29 DIAGNOSIS — M25512 Pain in left shoulder: Secondary | ICD-10-CM | POA: Diagnosis not present

## 2020-04-29 DIAGNOSIS — Z1331 Encounter for screening for depression: Secondary | ICD-10-CM | POA: Diagnosis not present

## 2020-04-29 DIAGNOSIS — M25511 Pain in right shoulder: Secondary | ICD-10-CM | POA: Diagnosis not present

## 2020-04-29 DIAGNOSIS — Z23 Encounter for immunization: Secondary | ICD-10-CM | POA: Diagnosis not present

## 2020-04-29 DIAGNOSIS — I10 Essential (primary) hypertension: Secondary | ICD-10-CM | POA: Diagnosis not present

## 2020-06-18 DIAGNOSIS — M159 Polyosteoarthritis, unspecified: Secondary | ICD-10-CM | POA: Diagnosis not present

## 2020-06-18 DIAGNOSIS — M25511 Pain in right shoulder: Secondary | ICD-10-CM | POA: Diagnosis not present

## 2020-06-18 DIAGNOSIS — Z6823 Body mass index (BMI) 23.0-23.9, adult: Secondary | ICD-10-CM | POA: Diagnosis not present

## 2020-06-18 DIAGNOSIS — E1121 Type 2 diabetes mellitus with diabetic nephropathy: Secondary | ICD-10-CM | POA: Diagnosis not present

## 2020-06-18 DIAGNOSIS — N1832 Chronic kidney disease, stage 3b: Secondary | ICD-10-CM | POA: Diagnosis not present

## 2020-06-18 DIAGNOSIS — M25512 Pain in left shoulder: Secondary | ICD-10-CM | POA: Diagnosis not present

## 2020-06-18 DIAGNOSIS — I10 Essential (primary) hypertension: Secondary | ICD-10-CM | POA: Diagnosis not present

## 2020-06-18 DIAGNOSIS — E785 Hyperlipidemia, unspecified: Secondary | ICD-10-CM | POA: Diagnosis not present

## 2020-06-24 DIAGNOSIS — Z20828 Contact with and (suspected) exposure to other viral communicable diseases: Secondary | ICD-10-CM | POA: Diagnosis not present

## 2020-06-24 DIAGNOSIS — R051 Acute cough: Secondary | ICD-10-CM | POA: Diagnosis not present

## 2020-06-26 DIAGNOSIS — J80 Acute respiratory distress syndrome: Secondary | ICD-10-CM | POA: Diagnosis not present

## 2020-06-26 DIAGNOSIS — E860 Dehydration: Secondary | ICD-10-CM | POA: Diagnosis not present

## 2020-06-26 DIAGNOSIS — R0902 Hypoxemia: Secondary | ICD-10-CM | POA: Diagnosis not present

## 2020-06-26 DIAGNOSIS — I517 Cardiomegaly: Secondary | ICD-10-CM | POA: Diagnosis not present

## 2020-06-26 DIAGNOSIS — Z4682 Encounter for fitting and adjustment of non-vascular catheter: Secondary | ICD-10-CM | POA: Diagnosis not present

## 2020-06-26 DIAGNOSIS — J9600 Acute respiratory failure, unspecified whether with hypoxia or hypercapnia: Secondary | ICD-10-CM | POA: Diagnosis not present

## 2020-06-26 DIAGNOSIS — R6521 Severe sepsis with septic shock: Secondary | ICD-10-CM | POA: Diagnosis not present

## 2020-06-26 DIAGNOSIS — J90 Pleural effusion, not elsewhere classified: Secondary | ICD-10-CM | POA: Diagnosis not present

## 2020-06-26 DIAGNOSIS — I482 Chronic atrial fibrillation, unspecified: Secondary | ICD-10-CM | POA: Diagnosis not present

## 2020-06-26 DIAGNOSIS — Z743 Need for continuous supervision: Secondary | ICD-10-CM | POA: Diagnosis not present

## 2020-06-26 DIAGNOSIS — R918 Other nonspecific abnormal finding of lung field: Secondary | ICD-10-CM | POA: Diagnosis not present

## 2020-06-26 DIAGNOSIS — R6339 Other feeding difficulties: Secondary | ICD-10-CM | POA: Diagnosis not present

## 2020-06-26 DIAGNOSIS — I700 Atherosclerosis of aorta: Secondary | ICD-10-CM | POA: Diagnosis not present

## 2020-06-26 DIAGNOSIS — J984 Other disorders of lung: Secondary | ICD-10-CM | POA: Diagnosis not present

## 2020-06-26 DIAGNOSIS — I361 Nonrheumatic tricuspid (valve) insufficiency: Secondary | ICD-10-CM | POA: Diagnosis not present

## 2020-06-26 DIAGNOSIS — K5989 Other specified functional intestinal disorders: Secondary | ICD-10-CM | POA: Diagnosis not present

## 2020-06-26 DIAGNOSIS — R404 Transient alteration of awareness: Secondary | ICD-10-CM | POA: Diagnosis not present

## 2020-06-26 DIAGNOSIS — J9811 Atelectasis: Secondary | ICD-10-CM | POA: Diagnosis not present

## 2020-06-26 DIAGNOSIS — A4189 Other specified sepsis: Secondary | ICD-10-CM | POA: Diagnosis not present

## 2020-06-26 DIAGNOSIS — R7989 Other specified abnormal findings of blood chemistry: Secondary | ICD-10-CM | POA: Diagnosis not present

## 2020-06-26 DIAGNOSIS — J159 Unspecified bacterial pneumonia: Secondary | ICD-10-CM | POA: Diagnosis not present

## 2020-06-26 DIAGNOSIS — J189 Pneumonia, unspecified organism: Secondary | ICD-10-CM | POA: Diagnosis not present

## 2020-06-26 DIAGNOSIS — R600 Localized edema: Secondary | ICD-10-CM | POA: Diagnosis not present

## 2020-06-26 DIAGNOSIS — M79604 Pain in right leg: Secondary | ICD-10-CM | POA: Diagnosis not present

## 2020-06-26 DIAGNOSIS — J9601 Acute respiratory failure with hypoxia: Secondary | ICD-10-CM | POA: Diagnosis not present

## 2020-06-26 DIAGNOSIS — M79605 Pain in left leg: Secondary | ICD-10-CM | POA: Diagnosis not present

## 2020-06-26 DIAGNOSIS — J9690 Respiratory failure, unspecified, unspecified whether with hypoxia or hypercapnia: Secondary | ICD-10-CM | POA: Diagnosis not present

## 2020-06-26 DIAGNOSIS — U071 COVID-19: Secondary | ICD-10-CM | POA: Diagnosis not present

## 2020-06-26 DIAGNOSIS — I4891 Unspecified atrial fibrillation: Secondary | ICD-10-CM | POA: Diagnosis not present

## 2020-06-26 DIAGNOSIS — Z9981 Dependence on supplemental oxygen: Secondary | ICD-10-CM | POA: Diagnosis not present

## 2020-06-26 DIAGNOSIS — J849 Interstitial pulmonary disease, unspecified: Secondary | ICD-10-CM | POA: Diagnosis not present

## 2020-06-26 DIAGNOSIS — Z9911 Dependence on respirator [ventilator] status: Secondary | ICD-10-CM | POA: Diagnosis not present

## 2020-06-26 DIAGNOSIS — N170 Acute kidney failure with tubular necrosis: Secondary | ICD-10-CM | POA: Diagnosis not present

## 2020-06-26 DIAGNOSIS — J9602 Acute respiratory failure with hypercapnia: Secondary | ICD-10-CM | POA: Diagnosis not present

## 2020-06-26 DIAGNOSIS — J1282 Pneumonia due to coronavirus disease 2019: Secondary | ICD-10-CM | POA: Diagnosis not present

## 2020-06-27 DIAGNOSIS — A4189 Other specified sepsis: Secondary | ICD-10-CM | POA: Diagnosis not present

## 2020-06-27 DIAGNOSIS — J1282 Pneumonia due to coronavirus disease 2019: Secondary | ICD-10-CM | POA: Diagnosis not present

## 2020-06-27 DIAGNOSIS — U071 COVID-19: Secondary | ICD-10-CM | POA: Diagnosis not present

## 2020-06-28 DIAGNOSIS — J1282 Pneumonia due to coronavirus disease 2019: Secondary | ICD-10-CM | POA: Diagnosis not present

## 2020-06-28 DIAGNOSIS — U071 COVID-19: Secondary | ICD-10-CM | POA: Diagnosis not present

## 2020-06-28 DIAGNOSIS — A4189 Other specified sepsis: Secondary | ICD-10-CM | POA: Diagnosis not present

## 2020-06-28 DIAGNOSIS — R7989 Other specified abnormal findings of blood chemistry: Secondary | ICD-10-CM | POA: Diagnosis not present

## 2020-06-29 DIAGNOSIS — J9601 Acute respiratory failure with hypoxia: Secondary | ICD-10-CM | POA: Diagnosis not present

## 2020-06-29 DIAGNOSIS — Z9981 Dependence on supplemental oxygen: Secondary | ICD-10-CM | POA: Diagnosis not present

## 2020-06-29 DIAGNOSIS — J90 Pleural effusion, not elsewhere classified: Secondary | ICD-10-CM | POA: Diagnosis not present

## 2020-06-29 DIAGNOSIS — J1282 Pneumonia due to coronavirus disease 2019: Secondary | ICD-10-CM | POA: Diagnosis not present

## 2020-06-29 DIAGNOSIS — U071 COVID-19: Secondary | ICD-10-CM | POA: Diagnosis not present

## 2020-06-29 DIAGNOSIS — A4189 Other specified sepsis: Secondary | ICD-10-CM | POA: Diagnosis not present

## 2020-06-30 DIAGNOSIS — Z9981 Dependence on supplemental oxygen: Secondary | ICD-10-CM | POA: Diagnosis not present

## 2020-06-30 DIAGNOSIS — J1282 Pneumonia due to coronavirus disease 2019: Secondary | ICD-10-CM | POA: Diagnosis not present

## 2020-06-30 DIAGNOSIS — J9601 Acute respiratory failure with hypoxia: Secondary | ICD-10-CM | POA: Diagnosis not present

## 2020-06-30 DIAGNOSIS — J9690 Respiratory failure, unspecified, unspecified whether with hypoxia or hypercapnia: Secondary | ICD-10-CM | POA: Diagnosis not present

## 2020-06-30 DIAGNOSIS — U071 COVID-19: Secondary | ICD-10-CM | POA: Diagnosis not present

## 2020-06-30 DIAGNOSIS — I517 Cardiomegaly: Secondary | ICD-10-CM | POA: Diagnosis not present

## 2020-06-30 DIAGNOSIS — J189 Pneumonia, unspecified organism: Secondary | ICD-10-CM | POA: Diagnosis not present

## 2020-07-01 DIAGNOSIS — J1282 Pneumonia due to coronavirus disease 2019: Secondary | ICD-10-CM | POA: Diagnosis not present

## 2020-07-01 DIAGNOSIS — J90 Pleural effusion, not elsewhere classified: Secondary | ICD-10-CM | POA: Diagnosis not present

## 2020-07-01 DIAGNOSIS — J9601 Acute respiratory failure with hypoxia: Secondary | ICD-10-CM | POA: Diagnosis not present

## 2020-07-01 DIAGNOSIS — Z9981 Dependence on supplemental oxygen: Secondary | ICD-10-CM | POA: Diagnosis not present

## 2020-07-01 DIAGNOSIS — J9690 Respiratory failure, unspecified, unspecified whether with hypoxia or hypercapnia: Secondary | ICD-10-CM | POA: Diagnosis not present

## 2020-07-01 DIAGNOSIS — A4189 Other specified sepsis: Secondary | ICD-10-CM | POA: Diagnosis not present

## 2020-07-01 DIAGNOSIS — U071 COVID-19: Secondary | ICD-10-CM | POA: Diagnosis not present

## 2020-07-02 DIAGNOSIS — J9690 Respiratory failure, unspecified, unspecified whether with hypoxia or hypercapnia: Secondary | ICD-10-CM | POA: Diagnosis not present

## 2020-07-02 DIAGNOSIS — U071 COVID-19: Secondary | ICD-10-CM | POA: Diagnosis not present

## 2020-07-02 DIAGNOSIS — J9601 Acute respiratory failure with hypoxia: Secondary | ICD-10-CM | POA: Diagnosis not present

## 2020-07-02 DIAGNOSIS — A4189 Other specified sepsis: Secondary | ICD-10-CM | POA: Diagnosis not present

## 2020-07-02 DIAGNOSIS — J849 Interstitial pulmonary disease, unspecified: Secondary | ICD-10-CM | POA: Diagnosis not present

## 2020-07-02 DIAGNOSIS — Z4682 Encounter for fitting and adjustment of non-vascular catheter: Secondary | ICD-10-CM | POA: Diagnosis not present

## 2020-07-02 DIAGNOSIS — I700 Atherosclerosis of aorta: Secondary | ICD-10-CM | POA: Diagnosis not present

## 2020-07-02 DIAGNOSIS — J1282 Pneumonia due to coronavirus disease 2019: Secondary | ICD-10-CM | POA: Diagnosis not present

## 2020-07-02 DIAGNOSIS — Z9981 Dependence on supplemental oxygen: Secondary | ICD-10-CM | POA: Diagnosis not present

## 2020-07-02 DIAGNOSIS — J90 Pleural effusion, not elsewhere classified: Secondary | ICD-10-CM | POA: Diagnosis not present

## 2020-07-03 DIAGNOSIS — U071 COVID-19: Secondary | ICD-10-CM | POA: Diagnosis not present

## 2020-07-03 DIAGNOSIS — J9690 Respiratory failure, unspecified, unspecified whether with hypoxia or hypercapnia: Secondary | ICD-10-CM | POA: Diagnosis not present

## 2020-07-03 DIAGNOSIS — J1282 Pneumonia due to coronavirus disease 2019: Secondary | ICD-10-CM | POA: Diagnosis not present

## 2020-07-03 DIAGNOSIS — J90 Pleural effusion, not elsewhere classified: Secondary | ICD-10-CM | POA: Diagnosis not present

## 2020-07-03 DIAGNOSIS — A4189 Other specified sepsis: Secondary | ICD-10-CM | POA: Diagnosis not present

## 2020-07-04 DIAGNOSIS — J1282 Pneumonia due to coronavirus disease 2019: Secondary | ICD-10-CM | POA: Diagnosis not present

## 2020-07-04 DIAGNOSIS — A4189 Other specified sepsis: Secondary | ICD-10-CM | POA: Diagnosis not present

## 2020-07-04 DIAGNOSIS — U071 COVID-19: Secondary | ICD-10-CM | POA: Diagnosis not present

## 2020-07-05 DIAGNOSIS — Z9981 Dependence on supplemental oxygen: Secondary | ICD-10-CM | POA: Diagnosis not present

## 2020-07-05 DIAGNOSIS — J1282 Pneumonia due to coronavirus disease 2019: Secondary | ICD-10-CM | POA: Diagnosis not present

## 2020-07-05 DIAGNOSIS — J9601 Acute respiratory failure with hypoxia: Secondary | ICD-10-CM | POA: Diagnosis not present

## 2020-07-05 DIAGNOSIS — A4189 Other specified sepsis: Secondary | ICD-10-CM | POA: Diagnosis not present

## 2020-07-05 DIAGNOSIS — U071 COVID-19: Secondary | ICD-10-CM | POA: Diagnosis not present

## 2020-07-06 DIAGNOSIS — J90 Pleural effusion, not elsewhere classified: Secondary | ICD-10-CM | POA: Diagnosis not present

## 2020-07-06 DIAGNOSIS — J1282 Pneumonia due to coronavirus disease 2019: Secondary | ICD-10-CM | POA: Diagnosis not present

## 2020-07-06 DIAGNOSIS — J9601 Acute respiratory failure with hypoxia: Secondary | ICD-10-CM | POA: Diagnosis not present

## 2020-07-06 DIAGNOSIS — J9690 Respiratory failure, unspecified, unspecified whether with hypoxia or hypercapnia: Secondary | ICD-10-CM | POA: Diagnosis not present

## 2020-07-06 DIAGNOSIS — A4189 Other specified sepsis: Secondary | ICD-10-CM | POA: Diagnosis not present

## 2020-07-06 DIAGNOSIS — U071 COVID-19: Secondary | ICD-10-CM | POA: Diagnosis not present

## 2020-07-06 DIAGNOSIS — J9811 Atelectasis: Secondary | ICD-10-CM | POA: Diagnosis not present

## 2020-07-06 DIAGNOSIS — Z9981 Dependence on supplemental oxygen: Secondary | ICD-10-CM | POA: Diagnosis not present

## 2020-07-07 DIAGNOSIS — J189 Pneumonia, unspecified organism: Secondary | ICD-10-CM | POA: Diagnosis not present

## 2020-07-07 DIAGNOSIS — J9601 Acute respiratory failure with hypoxia: Secondary | ICD-10-CM | POA: Diagnosis not present

## 2020-07-07 DIAGNOSIS — U071 COVID-19: Secondary | ICD-10-CM | POA: Diagnosis not present

## 2020-07-07 DIAGNOSIS — J90 Pleural effusion, not elsewhere classified: Secondary | ICD-10-CM | POA: Diagnosis not present

## 2020-07-07 DIAGNOSIS — A4189 Other specified sepsis: Secondary | ICD-10-CM | POA: Diagnosis not present

## 2020-07-07 DIAGNOSIS — Z9981 Dependence on supplemental oxygen: Secondary | ICD-10-CM | POA: Diagnosis not present

## 2020-07-07 DIAGNOSIS — R0902 Hypoxemia: Secondary | ICD-10-CM | POA: Diagnosis not present

## 2020-07-07 DIAGNOSIS — J1282 Pneumonia due to coronavirus disease 2019: Secondary | ICD-10-CM | POA: Diagnosis not present

## 2020-07-08 DIAGNOSIS — A4189 Other specified sepsis: Secondary | ICD-10-CM | POA: Diagnosis not present

## 2020-07-08 DIAGNOSIS — J1282 Pneumonia due to coronavirus disease 2019: Secondary | ICD-10-CM | POA: Diagnosis not present

## 2020-07-08 DIAGNOSIS — U071 COVID-19: Secondary | ICD-10-CM | POA: Diagnosis not present

## 2020-07-09 DIAGNOSIS — J9601 Acute respiratory failure with hypoxia: Secondary | ICD-10-CM | POA: Diagnosis not present

## 2020-07-09 DIAGNOSIS — I4891 Unspecified atrial fibrillation: Secondary | ICD-10-CM | POA: Diagnosis not present

## 2020-07-09 DIAGNOSIS — A4189 Other specified sepsis: Secondary | ICD-10-CM | POA: Diagnosis not present

## 2020-07-09 DIAGNOSIS — J1282 Pneumonia due to coronavirus disease 2019: Secondary | ICD-10-CM | POA: Diagnosis not present

## 2020-07-09 DIAGNOSIS — Z9981 Dependence on supplemental oxygen: Secondary | ICD-10-CM | POA: Diagnosis not present

## 2020-07-09 DIAGNOSIS — U071 COVID-19: Secondary | ICD-10-CM | POA: Diagnosis not present

## 2020-07-10 DIAGNOSIS — J9690 Respiratory failure, unspecified, unspecified whether with hypoxia or hypercapnia: Secondary | ICD-10-CM | POA: Diagnosis not present

## 2020-07-10 DIAGNOSIS — J90 Pleural effusion, not elsewhere classified: Secondary | ICD-10-CM | POA: Diagnosis not present

## 2020-07-10 DIAGNOSIS — A4189 Other specified sepsis: Secondary | ICD-10-CM | POA: Diagnosis not present

## 2020-07-10 DIAGNOSIS — U071 COVID-19: Secondary | ICD-10-CM | POA: Diagnosis not present

## 2020-07-10 DIAGNOSIS — J1282 Pneumonia due to coronavirus disease 2019: Secondary | ICD-10-CM | POA: Diagnosis not present

## 2020-07-11 DIAGNOSIS — U071 COVID-19: Secondary | ICD-10-CM | POA: Diagnosis not present

## 2020-07-11 DIAGNOSIS — A4189 Other specified sepsis: Secondary | ICD-10-CM | POA: Diagnosis not present

## 2020-07-11 DIAGNOSIS — J1282 Pneumonia due to coronavirus disease 2019: Secondary | ICD-10-CM | POA: Diagnosis not present

## 2020-07-12 DIAGNOSIS — Z9981 Dependence on supplemental oxygen: Secondary | ICD-10-CM | POA: Diagnosis not present

## 2020-07-12 DIAGNOSIS — R918 Other nonspecific abnormal finding of lung field: Secondary | ICD-10-CM | POA: Diagnosis not present

## 2020-07-12 DIAGNOSIS — R600 Localized edema: Secondary | ICD-10-CM | POA: Diagnosis not present

## 2020-07-12 DIAGNOSIS — J90 Pleural effusion, not elsewhere classified: Secondary | ICD-10-CM | POA: Diagnosis not present

## 2020-07-12 DIAGNOSIS — J1282 Pneumonia due to coronavirus disease 2019: Secondary | ICD-10-CM | POA: Diagnosis not present

## 2020-07-12 DIAGNOSIS — A4189 Other specified sepsis: Secondary | ICD-10-CM | POA: Diagnosis not present

## 2020-07-12 DIAGNOSIS — M79604 Pain in right leg: Secondary | ICD-10-CM | POA: Diagnosis not present

## 2020-07-12 DIAGNOSIS — J9601 Acute respiratory failure with hypoxia: Secondary | ICD-10-CM | POA: Diagnosis not present

## 2020-07-12 DIAGNOSIS — U071 COVID-19: Secondary | ICD-10-CM | POA: Diagnosis not present

## 2020-07-12 DIAGNOSIS — M79605 Pain in left leg: Secondary | ICD-10-CM | POA: Diagnosis not present

## 2020-07-13 DIAGNOSIS — Z9981 Dependence on supplemental oxygen: Secondary | ICD-10-CM | POA: Diagnosis not present

## 2020-07-13 DIAGNOSIS — J1282 Pneumonia due to coronavirus disease 2019: Secondary | ICD-10-CM | POA: Diagnosis not present

## 2020-07-13 DIAGNOSIS — J9690 Respiratory failure, unspecified, unspecified whether with hypoxia or hypercapnia: Secondary | ICD-10-CM | POA: Diagnosis not present

## 2020-07-13 DIAGNOSIS — J189 Pneumonia, unspecified organism: Secondary | ICD-10-CM | POA: Diagnosis not present

## 2020-07-13 DIAGNOSIS — U071 COVID-19: Secondary | ICD-10-CM | POA: Diagnosis not present

## 2020-07-13 DIAGNOSIS — J9601 Acute respiratory failure with hypoxia: Secondary | ICD-10-CM | POA: Diagnosis not present

## 2020-07-13 DIAGNOSIS — A4189 Other specified sepsis: Secondary | ICD-10-CM | POA: Diagnosis not present

## 2020-07-14 DIAGNOSIS — J1282 Pneumonia due to coronavirus disease 2019: Secondary | ICD-10-CM | POA: Diagnosis not present

## 2020-07-14 DIAGNOSIS — J9601 Acute respiratory failure with hypoxia: Secondary | ICD-10-CM | POA: Diagnosis not present

## 2020-07-14 DIAGNOSIS — U071 COVID-19: Secondary | ICD-10-CM | POA: Diagnosis not present

## 2020-07-14 DIAGNOSIS — R918 Other nonspecific abnormal finding of lung field: Secondary | ICD-10-CM | POA: Diagnosis not present

## 2020-07-14 DIAGNOSIS — Z9981 Dependence on supplemental oxygen: Secondary | ICD-10-CM | POA: Diagnosis not present

## 2020-07-14 DIAGNOSIS — Z9911 Dependence on respirator [ventilator] status: Secondary | ICD-10-CM | POA: Diagnosis not present

## 2020-07-14 DIAGNOSIS — A4189 Other specified sepsis: Secondary | ICD-10-CM | POA: Diagnosis not present

## 2020-07-15 DIAGNOSIS — A4189 Other specified sepsis: Secondary | ICD-10-CM | POA: Diagnosis not present

## 2020-07-15 DIAGNOSIS — I361 Nonrheumatic tricuspid (valve) insufficiency: Secondary | ICD-10-CM | POA: Diagnosis not present

## 2020-07-15 DIAGNOSIS — U071 COVID-19: Secondary | ICD-10-CM | POA: Diagnosis not present

## 2020-07-15 DIAGNOSIS — J9601 Acute respiratory failure with hypoxia: Secondary | ICD-10-CM | POA: Diagnosis not present

## 2020-07-15 DIAGNOSIS — Z9981 Dependence on supplemental oxygen: Secondary | ICD-10-CM | POA: Diagnosis not present

## 2020-07-15 DIAGNOSIS — J1282 Pneumonia due to coronavirus disease 2019: Secondary | ICD-10-CM | POA: Diagnosis not present

## 2020-07-16 DIAGNOSIS — R918 Other nonspecific abnormal finding of lung field: Secondary | ICD-10-CM | POA: Diagnosis not present

## 2020-07-16 DIAGNOSIS — J90 Pleural effusion, not elsewhere classified: Secondary | ICD-10-CM | POA: Diagnosis not present

## 2020-07-16 DIAGNOSIS — J9690 Respiratory failure, unspecified, unspecified whether with hypoxia or hypercapnia: Secondary | ICD-10-CM | POA: Diagnosis not present

## 2020-07-16 DIAGNOSIS — U071 COVID-19: Secondary | ICD-10-CM | POA: Diagnosis not present

## 2020-07-16 DIAGNOSIS — J1282 Pneumonia due to coronavirus disease 2019: Secondary | ICD-10-CM | POA: Diagnosis not present

## 2020-07-16 DIAGNOSIS — Z4682 Encounter for fitting and adjustment of non-vascular catheter: Secondary | ICD-10-CM | POA: Diagnosis not present

## 2020-07-16 DIAGNOSIS — A4189 Other specified sepsis: Secondary | ICD-10-CM | POA: Diagnosis not present

## 2020-07-17 DIAGNOSIS — J1282 Pneumonia due to coronavirus disease 2019: Secondary | ICD-10-CM | POA: Diagnosis not present

## 2020-07-17 DIAGNOSIS — A4189 Other specified sepsis: Secondary | ICD-10-CM | POA: Diagnosis not present

## 2020-07-17 DIAGNOSIS — U071 COVID-19: Secondary | ICD-10-CM | POA: Diagnosis not present

## 2020-07-18 DIAGNOSIS — A4189 Other specified sepsis: Secondary | ICD-10-CM | POA: Diagnosis not present

## 2020-07-18 DIAGNOSIS — U071 COVID-19: Secondary | ICD-10-CM | POA: Diagnosis not present

## 2020-07-18 DIAGNOSIS — J1282 Pneumonia due to coronavirus disease 2019: Secondary | ICD-10-CM | POA: Diagnosis not present

## 2020-07-19 DIAGNOSIS — I4891 Unspecified atrial fibrillation: Secondary | ICD-10-CM | POA: Diagnosis not present

## 2020-07-19 DIAGNOSIS — A4189 Other specified sepsis: Secondary | ICD-10-CM | POA: Diagnosis not present

## 2020-07-19 DIAGNOSIS — J9601 Acute respiratory failure with hypoxia: Secondary | ICD-10-CM | POA: Diagnosis not present

## 2020-07-19 DIAGNOSIS — U071 COVID-19: Secondary | ICD-10-CM | POA: Diagnosis not present

## 2020-07-19 DIAGNOSIS — R6339 Other feeding difficulties: Secondary | ICD-10-CM | POA: Diagnosis not present

## 2020-07-19 DIAGNOSIS — J1282 Pneumonia due to coronavirus disease 2019: Secondary | ICD-10-CM | POA: Diagnosis not present

## 2020-07-19 DIAGNOSIS — J9600 Acute respiratory failure, unspecified whether with hypoxia or hypercapnia: Secondary | ICD-10-CM | POA: Diagnosis not present

## 2020-07-19 DIAGNOSIS — Z9981 Dependence on supplemental oxygen: Secondary | ICD-10-CM | POA: Diagnosis not present

## 2020-07-20 DIAGNOSIS — U071 COVID-19: Secondary | ICD-10-CM | POA: Diagnosis not present

## 2020-07-20 DIAGNOSIS — Z9981 Dependence on supplemental oxygen: Secondary | ICD-10-CM | POA: Diagnosis not present

## 2020-07-20 DIAGNOSIS — K5989 Other specified functional intestinal disorders: Secondary | ICD-10-CM | POA: Diagnosis not present

## 2020-07-20 DIAGNOSIS — R6339 Other feeding difficulties: Secondary | ICD-10-CM | POA: Diagnosis not present

## 2020-07-20 DIAGNOSIS — A4189 Other specified sepsis: Secondary | ICD-10-CM | POA: Diagnosis not present

## 2020-07-20 DIAGNOSIS — J9600 Acute respiratory failure, unspecified whether with hypoxia or hypercapnia: Secondary | ICD-10-CM | POA: Diagnosis not present

## 2020-07-20 DIAGNOSIS — J90 Pleural effusion, not elsewhere classified: Secondary | ICD-10-CM | POA: Diagnosis not present

## 2020-07-20 DIAGNOSIS — J9601 Acute respiratory failure with hypoxia: Secondary | ICD-10-CM | POA: Diagnosis not present

## 2020-07-20 DIAGNOSIS — J9690 Respiratory failure, unspecified, unspecified whether with hypoxia or hypercapnia: Secondary | ICD-10-CM | POA: Diagnosis not present

## 2020-07-20 DIAGNOSIS — J1282 Pneumonia due to coronavirus disease 2019: Secondary | ICD-10-CM | POA: Diagnosis not present

## 2020-07-20 DIAGNOSIS — I4891 Unspecified atrial fibrillation: Secondary | ICD-10-CM | POA: Diagnosis not present

## 2020-07-21 DIAGNOSIS — J9600 Acute respiratory failure, unspecified whether with hypoxia or hypercapnia: Secondary | ICD-10-CM | POA: Diagnosis not present

## 2020-07-21 DIAGNOSIS — Z9981 Dependence on supplemental oxygen: Secondary | ICD-10-CM | POA: Diagnosis not present

## 2020-07-21 DIAGNOSIS — R6339 Other feeding difficulties: Secondary | ICD-10-CM | POA: Diagnosis not present

## 2020-07-21 DIAGNOSIS — A4189 Other specified sepsis: Secondary | ICD-10-CM | POA: Diagnosis not present

## 2020-07-21 DIAGNOSIS — J9601 Acute respiratory failure with hypoxia: Secondary | ICD-10-CM | POA: Diagnosis not present

## 2020-07-21 DIAGNOSIS — J9690 Respiratory failure, unspecified, unspecified whether with hypoxia or hypercapnia: Secondary | ICD-10-CM | POA: Diagnosis not present

## 2020-07-21 DIAGNOSIS — U071 COVID-19: Secondary | ICD-10-CM | POA: Diagnosis not present

## 2020-07-21 DIAGNOSIS — J1282 Pneumonia due to coronavirus disease 2019: Secondary | ICD-10-CM | POA: Diagnosis not present

## 2020-07-21 DIAGNOSIS — I4891 Unspecified atrial fibrillation: Secondary | ICD-10-CM | POA: Diagnosis not present

## 2020-07-21 DIAGNOSIS — J189 Pneumonia, unspecified organism: Secondary | ICD-10-CM | POA: Diagnosis not present

## 2020-07-22 DIAGNOSIS — R6339 Other feeding difficulties: Secondary | ICD-10-CM | POA: Diagnosis not present

## 2020-07-22 DIAGNOSIS — J9601 Acute respiratory failure with hypoxia: Secondary | ICD-10-CM | POA: Diagnosis not present

## 2020-07-22 DIAGNOSIS — J9600 Acute respiratory failure, unspecified whether with hypoxia or hypercapnia: Secondary | ICD-10-CM | POA: Diagnosis not present

## 2020-07-22 DIAGNOSIS — I4891 Unspecified atrial fibrillation: Secondary | ICD-10-CM | POA: Diagnosis not present

## 2020-07-22 DIAGNOSIS — A4189 Other specified sepsis: Secondary | ICD-10-CM | POA: Diagnosis not present

## 2020-07-22 DIAGNOSIS — U071 COVID-19: Secondary | ICD-10-CM | POA: Diagnosis not present

## 2020-07-22 DIAGNOSIS — J1282 Pneumonia due to coronavirus disease 2019: Secondary | ICD-10-CM | POA: Diagnosis not present

## 2020-07-22 DIAGNOSIS — Z9981 Dependence on supplemental oxygen: Secondary | ICD-10-CM | POA: Diagnosis not present

## 2020-07-23 DIAGNOSIS — J9601 Acute respiratory failure with hypoxia: Secondary | ICD-10-CM | POA: Diagnosis not present

## 2020-07-23 DIAGNOSIS — J984 Other disorders of lung: Secondary | ICD-10-CM | POA: Diagnosis not present

## 2020-07-23 DIAGNOSIS — U071 COVID-19: Secondary | ICD-10-CM | POA: Diagnosis not present

## 2020-07-23 DIAGNOSIS — Z9981 Dependence on supplemental oxygen: Secondary | ICD-10-CM | POA: Diagnosis not present

## 2020-07-23 DIAGNOSIS — J9690 Respiratory failure, unspecified, unspecified whether with hypoxia or hypercapnia: Secondary | ICD-10-CM | POA: Diagnosis not present

## 2020-07-23 DIAGNOSIS — A4189 Other specified sepsis: Secondary | ICD-10-CM | POA: Diagnosis not present

## 2020-07-23 DIAGNOSIS — I4891 Unspecified atrial fibrillation: Secondary | ICD-10-CM | POA: Diagnosis not present

## 2020-07-23 DIAGNOSIS — J1282 Pneumonia due to coronavirus disease 2019: Secondary | ICD-10-CM | POA: Diagnosis not present

## 2020-07-24 DIAGNOSIS — I4891 Unspecified atrial fibrillation: Secondary | ICD-10-CM | POA: Diagnosis not present

## 2020-07-24 DIAGNOSIS — U071 COVID-19: Secondary | ICD-10-CM | POA: Diagnosis not present

## 2020-07-24 DIAGNOSIS — J9690 Respiratory failure, unspecified, unspecified whether with hypoxia or hypercapnia: Secondary | ICD-10-CM | POA: Diagnosis not present

## 2020-07-24 DIAGNOSIS — J90 Pleural effusion, not elsewhere classified: Secondary | ICD-10-CM | POA: Diagnosis not present

## 2020-07-24 DIAGNOSIS — J9601 Acute respiratory failure with hypoxia: Secondary | ICD-10-CM | POA: Diagnosis not present

## 2020-08-24 DEATH — deceased
